# Patient Record
Sex: Male | Born: 1945 | Race: White | Hispanic: No | Marital: Married | Smoking: Former smoker
Health system: Southern US, Community
[De-identification: ages and names within clinical notes are randomized; demographics above are authoritative.]

## PROBLEM LIST (undated history)

## (undated) DIAGNOSIS — R110 Nausea: Secondary | ICD-10-CM

## (undated) DIAGNOSIS — J45909 Unspecified asthma, uncomplicated: Secondary | ICD-10-CM

## (undated) DIAGNOSIS — M25552 Pain in left hip: Secondary | ICD-10-CM

## (undated) DIAGNOSIS — E7800 Pure hypercholesterolemia, unspecified: Secondary | ICD-10-CM

## (undated) DIAGNOSIS — M25551 Pain in right hip: Secondary | ICD-10-CM

## (undated) DIAGNOSIS — I10 Essential (primary) hypertension: Secondary | ICD-10-CM

## (undated) DIAGNOSIS — K219 Gastro-esophageal reflux disease without esophagitis: Secondary | ICD-10-CM

## (undated) DIAGNOSIS — M1990 Unspecified osteoarthritis, unspecified site: Secondary | ICD-10-CM

## (undated) DIAGNOSIS — F419 Anxiety disorder, unspecified: Secondary | ICD-10-CM

## (undated) HISTORY — PX: COLONOSCOPY: SHX174

## (undated) HISTORY — PX: SEPTOPLASTY: SUR1290

## (undated) HISTORY — PX: HERNIA REPAIR: SHX51

## (undated) HISTORY — DX: Essential (primary) hypertension: I10

## (undated) HISTORY — DX: Gastro-esophageal reflux disease without esophagitis: K21.9

## (undated) HISTORY — DX: Nausea: R11.0

## (undated) HISTORY — PX: SHOULDER ARTHROSCOPY WITH LABRAL REPAIR: SHX5691

## (undated) HISTORY — DX: Pain in right hip: M25.551

## (undated) HISTORY — PX: UPPER GASTROINTESTINAL ENDOSCOPY: SHX188

## (undated) HISTORY — DX: Pain in left hip: M25.552

---

## 2015-07-06 ENCOUNTER — Encounter (INDEPENDENT_AMBULATORY_CARE_PROVIDER_SITE_OTHER): Payer: Self-pay | Admitting: *Deleted

## 2015-07-28 ENCOUNTER — Encounter (INDEPENDENT_AMBULATORY_CARE_PROVIDER_SITE_OTHER): Payer: Self-pay | Admitting: *Deleted

## 2015-07-28 ENCOUNTER — Encounter (INDEPENDENT_AMBULATORY_CARE_PROVIDER_SITE_OTHER): Payer: Self-pay

## 2015-10-18 ENCOUNTER — Ambulatory Visit (INDEPENDENT_AMBULATORY_CARE_PROVIDER_SITE_OTHER): Payer: Medicare Other | Admitting: Internal Medicine

## 2015-10-18 ENCOUNTER — Encounter (INDEPENDENT_AMBULATORY_CARE_PROVIDER_SITE_OTHER): Payer: Self-pay | Admitting: Internal Medicine

## 2015-10-18 VITALS — BP 150/90 | HR 70 | Temp 98.6°F | Resp 18 | Ht 73.0 in | Wt 204.0 lb

## 2015-10-18 DIAGNOSIS — I10 Essential (primary) hypertension: Secondary | ICD-10-CM | POA: Insufficient documentation

## 2015-10-18 DIAGNOSIS — R1310 Dysphagia, unspecified: Secondary | ICD-10-CM

## 2015-10-18 DIAGNOSIS — R1314 Dysphagia, pharyngoesophageal phase: Secondary | ICD-10-CM | POA: Diagnosis not present

## 2015-10-18 DIAGNOSIS — R1319 Other dysphagia: Secondary | ICD-10-CM

## 2015-10-18 DIAGNOSIS — K219 Gastro-esophageal reflux disease without esophagitis: Secondary | ICD-10-CM

## 2015-10-18 DIAGNOSIS — Z86010 Personal history of colonic polyps: Secondary | ICD-10-CM

## 2015-10-18 DIAGNOSIS — J45909 Unspecified asthma, uncomplicated: Secondary | ICD-10-CM | POA: Insufficient documentation

## 2015-10-18 NOTE — Patient Instructions (Signed)
Esophagogastroduodenoscopy with esophageal dilation and colonoscopy to be scheduled. High fiber diet.

## 2015-10-19 ENCOUNTER — Other Ambulatory Visit (INDEPENDENT_AMBULATORY_CARE_PROVIDER_SITE_OTHER): Payer: Self-pay | Admitting: Internal Medicine

## 2015-10-19 ENCOUNTER — Encounter (INDEPENDENT_AMBULATORY_CARE_PROVIDER_SITE_OTHER): Payer: Self-pay | Admitting: *Deleted

## 2015-10-19 ENCOUNTER — Other Ambulatory Visit (INDEPENDENT_AMBULATORY_CARE_PROVIDER_SITE_OTHER): Payer: Self-pay | Admitting: *Deleted

## 2015-10-19 DIAGNOSIS — Z86010 Personal history of colonic polyps: Secondary | ICD-10-CM

## 2015-10-19 DIAGNOSIS — R1310 Dysphagia, unspecified: Secondary | ICD-10-CM

## 2015-10-19 MED ORDER — PEG 3350-KCL-NA BICARB-NACL 420 G PO SOLR: 4000.0000 mL | Freq: Once | ORAL | Status: DC

## 2015-10-19 NOTE — Progress Notes (Signed)
Presenting complaint;  Solid food dysphagia and history of polyps.  History of present illness.:  Noah Rodgers is 70 year old Caucasian male who is referred through courtesy of this Shari Prows FNP for GI evaluation. Patient's GI physicians in [place] have retired. Patient states he has had symptoms of GERD for well over 10 years. He had EGD with esophageal dilation in December 2006. He was found to have hiatal hernia and Schatzki's ring. He underwent EGD again in 2009. He states esophageal dilation has provided relief only for a few months. He has difficulty swallowing solids. He has no difficulty with liquids. He states he needs to meet he has to cut into small cubes otherwise it does not go down. He points to suprasternal area site of bolus obstruction. He states heartburns well controlled with therapy. He denies hoarseness chronic cough or so throat. He watches his diet. He has history of colonic polyps. Last exam was 5 years ago and no polyps were found. He denies melena or rectal bleeding or diarrhea. At times he feels his stool is hard. He is trying to be fiber rich foods. He has good appetite and has not lost any weight.  Current Medications: Outpatient Encounter Prescriptions as of 10/18/2015  Medication Sig  . ALBUTEROL IN Inhale 2 puffs into the lungs every 6 (six) hours as needed.  Marland Kitchen amlodipine-olmesartan (AZOR) 10-20 MG tablet Take 1 tablet by mouth daily.  Marland Kitchen aspirin 81 MG tablet Take 81 mg by mouth daily.  . diazepam (VALIUM) 5 MG tablet Take 5 mg by mouth. Patient states that he takes 1/2 tablet as needed at night.  Marland Kitchen ibuprofen (ADVIL,MOTRIN) 800 MG tablet Take 800 mg by mouth as needed.  . lansoprazole (PREVACID) 30 MG capsule Take 30 mg by mouth daily at 12 noon.  . Misc Natural Products (PROSTATE SUPPORT PO) Take by mouth daily.  . montelukast (SINGULAIR) 10 MG tablet Take 10 mg by mouth daily.   No facility-administered encounter medications on file as of 10/18/2015.   Past  medical history: Hypertension of more than 10 years duration. Adult onset bronchial last month. He has not had a recent flareup. Chronic insomnia. Left shoulder pain secondary to rotator cuff tear. GERD of over 12 years duration. He had EGD with EGD in December 2006 and repeat procedure in May 2009. Benign prostatic hypertrophy. History of colonic polyps. He has had 3 colonoscopies. Polyps were found on the first exam but not on the subsequent exams. Last exam was in March 2011. History of diverticulitis. Chronic pain over right rib cage for which she has had steroid injections.   Allergies; No Known Allergies  Family history: Mother had her with arthritis and chronic kidney disease and died at age 73. Father lived to be 24. He had hypertension and multiple colonic polyps. He has one brother is 17 W. type 2 diabetes mellitus and one sister age 25 in good health.  Social history: He is married and has 2 grownup sons. He retired in 2015. He worked in Psychologist, forensic and also an Education officer, environmental for 35 years. He smokes cigarettes for about one year when he was in high school. He drinks alcohol no more than 10 drinks per week. He walks 1-2 miles daily.   Physical examination: Blood pressure 150/90, pulse 70, temperature 98.6 F (37 C), temperature source Oral, resp. rate 18, height 6\' 1"  (1.854 m), weight 204 lb (92.534 kg). Patient is alert and in no acute distress. Conjunctiva is pink. Sclera is nonicteric Oropharyngeal  mucosa is normal. No neck masses or thyromegaly noted. Cardiac exam with regular rhythm normal S1 and S2. No murmur or gallop noted. Lungs are clear to auscultation. Abdomen is symmetrical soft and nontender without organomegaly or masses. No LE edema or clubbing noted.    Assessment:  #1.  Esophageal dysphagia. He has history of Schatzki's ring which was last manipulated  2009. howeversymptom relief was tranient. Biopsy was negative for eosnophilic  esophagiis. Given his history eosinophilic esophagitis remainsin the differential. #2. Chronic GERD symptoms well controlled with therapy. #3.  History of colonic adenomas noted on first colonoscopy but not on the last 2 colonoscopies. He is due for surveillance colonoscopy.   Recommendations:  Esophagogastroduodenoscopy with esophageal dilation and surveillance colonoscopy in near future to be performed under monitored anesthesia care. Patient's questions have been answered and he is ready to proceed with endoscopic evaluation.

## 2015-10-19 NOTE — Telephone Encounter (Signed)
Patient needs trilyte 

## 2015-10-20 ENCOUNTER — Encounter (INDEPENDENT_AMBULATORY_CARE_PROVIDER_SITE_OTHER): Payer: Self-pay | Admitting: *Deleted

## 2015-11-22 ENCOUNTER — Other Ambulatory Visit (HOSPITAL_COMMUNITY): Payer: Medicare Other

## 2015-12-05 ENCOUNTER — Other Ambulatory Visit (HOSPITAL_COMMUNITY): Payer: Medicare Other

## 2015-12-09 ENCOUNTER — Ambulatory Visit (HOSPITAL_COMMUNITY): Admit: 2015-12-09 | Payer: Self-pay | Admitting: Internal Medicine

## 2015-12-09 ENCOUNTER — Encounter (HOSPITAL_COMMUNITY): Payer: Self-pay

## 2015-12-09 SURGERY — COLONOSCOPY WITH PROPOFOL
Anesthesia: Monitor Anesthesia Care

## 2016-05-16 ENCOUNTER — Encounter (INDEPENDENT_AMBULATORY_CARE_PROVIDER_SITE_OTHER): Payer: Self-pay | Admitting: *Deleted

## 2016-05-16 ENCOUNTER — Other Ambulatory Visit (INDEPENDENT_AMBULATORY_CARE_PROVIDER_SITE_OTHER): Payer: Self-pay | Admitting: *Deleted

## 2016-05-16 DIAGNOSIS — R1310 Dysphagia, unspecified: Secondary | ICD-10-CM

## 2016-05-16 DIAGNOSIS — Z86010 Personal history of colonic polyps: Secondary | ICD-10-CM

## 2016-05-18 ENCOUNTER — Telehealth (INDEPENDENT_AMBULATORY_CARE_PROVIDER_SITE_OTHER): Payer: Self-pay | Admitting: *Deleted

## 2016-05-18 NOTE — Telephone Encounter (Signed)
Referring MD/PCP: dianne elliott   Procedure: tcs/egd/ed  Reason/Indication:  Hx polyps, dysphagia  Has patient had this procedure before?  Yes--TCS (2011); EGD (2009)  If so, when, by whom and where?    Is there a family history of colon cancer?  no  Who?  What age when diagnosed?    Is patient diabetic?   no      Does patient have prosthetic heart valve or mechanical valve?  no  Do you have a pacemaker?  no  Has patient ever had endocarditis? no  Has patient had joint replacement within last 12 months?  no  Does patient tend to be constipated or take laxatives? no  Does patient have a history of alcohol/drug use?  no  Is patient on Coumadin, Plavix and/or Aspirin? yes  Medications: see epic  Allergies: nkda  Medication Adjustment: asa 2 days  Procedure date & time: 06/15/16 at 855 preop 11/28 @ 10

## 2016-05-21 NOTE — Telephone Encounter (Signed)
agree

## 2016-06-06 NOTE — Patient Instructions (Signed)
Noah Rodgers  06/06/2016     @PREFPERIOPPHARMACY @   Your procedure is scheduled on 06/15/2016.  Report to [facility] at 7:25 A.M.  Call this number if you have problems the morning of surgery:  [phone]   Remember:  Do not eat food or drink liquids after midnight.  Take these medicines the morning of surgery with A SIP OF WATER Albuterol inhaler and bring with you to the hospital, Valium, Hydrocodone, Prevacid, Singulair   Do not wear jewelry, make-up or nail polish.  Do not wear lotions, powders, or perfumes, or deoderant.  Do not shave 48 hours prior to surgery.  Men may shave face and neck.  Do not bring valuables to the hospital.  [facility] is not responsible for any belongings or valuables.  Contacts, dentures or bridgework may not be worn into surgery.  Leave your suitcase in the car.  After surgery it may be brought to your room.  For patients admitted to the hospital, discharge time will be determined by your treatment team.  Patients discharged the day of surgery will not be allowed to drive home.    Please read over the following fact sheets that you were given. Anesthesia Post-op Instructions     PATIENT INSTRUCTIONS POST-ANESTHESIA  IMMEDIATELY FOLLOWING SURGERY:  Do not drive or operate machinery for the first twenty four hours after surgery.  Do not make any important decisions for twenty four hours after surgery or while taking narcotic pain medications or sedatives.  If you develop intractable nausea and vomiting or a severe headache please notify your doctor immediately.  FOLLOW-UP:  Please make an appointment with your surgeon as instructed. You do not need to follow up with anesthesia unless specifically instructed to do so.  WOUND CARE INSTRUCTIONS (if applicable):  Keep a dry clean dressing on the anesthesia/puncture wound site if there is drainage.  Once the wound has quit draining you may leave it open to air.  Generally you should leave the  bandage intact for twenty four hours unless there is drainage.  If the epidural site drains for more than 36-48 hours please call the anesthesia department.  QUESTIONS?:  Please feel free to call your physician or the hospital operator if you have any questions, and they will be happy to assist you.      Esophagogastroduodenoscopy Introduction Esophagogastroduodenoscopy (EGD) is a procedure to examine the lining of the esophagus, stomach, and first part of the small intestine (duodenum). This procedure is done to check for problems such as inflammation, bleeding, ulcers, or growths. During this procedure, a long, flexible, lighted tube with a camera attached (endoscope) is inserted down the throat. Tell a health care provider about:  Any allergies you have.  All medicines you are taking, including vitamins, herbs, eye drops, creams, and over-the-counter medicines.  Any problems you or family members have had with anesthetic medicines.  Any blood disorders you have.  Any surgeries you have had.  Any medical conditions you have.  Whether you are pregnant or may be pregnant. What are the risks? Generally, this is a safe procedure. However, problems may occur, including:  Infection.  Bleeding.  A tear (perforation) in the esophagus, stomach, or duodenum.  Trouble breathing.  Excessive sweating.  Spasms of the larynx.  A slowed heartbeat.  Low blood pressure. What happens before the procedure?  Follow instructions from your health care provider about eating or drinking restrictions.  Ask your health care provider about:  Changing or stopping  your regular medicines. This is especially important if you are taking diabetes medicines or blood thinners.  Taking medicines such as aspirin and ibuprofen. These medicines can thin your blood. Do not take these medicines before your procedure if your health care provider instructs you not to.  Plan to have someone take you home  after the procedure.  If you wear dentures, be ready to remove them before the procedure. What happens during the procedure?  To reduce your risk of infection, your health care team will wash or sanitize their hands.  An IV tube will be put in a vein in your hand or arm. You will get medicines and fluids through this tube.  You will be given one or more of the following:  A medicine to help you relax (sedative).  A medicine to numb the area (local anesthetic). This medicine may be sprayed into your throat. It will make you feel more comfortable and keep you from gagging or coughing during the procedure.  A medicine for pain.  A mouth guard may be placed in your mouth to protect your teeth and to keep you from biting on the endoscope.  You will be asked to lie on your left side.  The endoscope will be lowered down your throat into your esophagus, stomach, and duodenum.  Air will be put into the endoscope. This will help your health care provider see better.  The lining of your esophagus, stomach, and duodenum will be examined.  Your health care provider may:  Take a tissue sample so it can be looked at in a lab (biopsy).  Remove growths.  Remove objects (foreign bodies) that are stuck.  Treat any bleeding with medicines or other devices that stop tissue from bleeding.  Widen (dilate) or stretch narrowed areas of your esophagus and stomach.  The endoscope will be taken out. The procedure may vary among health care providers and hospitals. What happens after the procedure?  Your blood pressure, heart rate, breathing rate, and blood oxygen level will be monitored often until the medicines you were given have worn off.  Do not eat or drink anything until the numbing medicine has worn off and your gag reflex has returned. This information is not intended to replace advice given to you by your health care provider. Make sure you discuss any questions you have with your health  care provider. Document Released: 11/02/2004 Document Revised: 12/08/2015 Document Reviewed: 05/26/2015  2017 Elsevier Esophageal Dilatation Esophageal dilatation is a procedure to open a blocked or narrowed part of the esophagus. The esophagus is the long tube in your throat that carries food and liquid from your mouth to your stomach. The procedure is also called esophageal dilation. You may need this procedure if you have a buildup of scar tissue in your esophagus that makes it difficult, painful, or even impossible to swallow. This can be caused by gastroesophageal reflux disease (GERD). In rare cases, people need this procedure because they have cancer of the esophagus or a problem with the way food moves through the esophagus. Sometimes you may need to have another dilatation to enlarge the opening of the esophagus gradually. Tell a health care provider about:  Any allergies you have.  All medicines you are taking, including vitamins, herbs, eye drops, creams, and over-the-counter medicines.  Any problems you or family members have had with anesthetic medicines.  Any blood disorders you have.  Any surgeries you have had.  Any medical conditions you have.  Any antibiotic  medicines you are required to take before dental procedures. What are the risks? Generally, this is a safe procedure. However, problems can occur and include:  Bleeding from a tear in the lining of the esophagus.  A hole (perforation) in the esophagus. What happens before the procedure?  Do not eat or drink anything after midnight on the night before the procedure or as directed by your health care provider.  Ask your health care provider about changing or stopping your regular medicines. This is especially important if you are taking diabetes medicines or blood thinners.  Plan to have someone take you home after the procedure. What happens during the procedure?  You will be given a medicine that makes you  relaxed and sleepy (sedative).  A medicine may be sprayed or gargled to numb the back of the throat.  Your health care provider can use various instruments to do an esophageal dilatation. During the procedure, the instrument used will be placed in your mouth and passed down into your esophagus. Options include:  Simple dilators. This instrument is carefully placed in the esophagus to stretch it.  Guided wire bougies. In this method, a flexible tube (endoscope) is used to insert a wire into the esophagus. The dilator is passed over this wire to enlarge the esophagus. Then the wire is removed.  Balloon dilators. An endoscope with a small balloon at the end is passed down into the esophagus. Inflating the balloon gently stretches the esophagus and opens it up. What happens after the procedure?  Your blood pressure, heart rate, breathing rate, and blood oxygen level will be monitored often until the medicines you were given have worn off.  Your throat may feel slightly sore and will probably still feel numb. This will improve slowly over time.  You will not be allowed to eat or drink until the throat numbness has resolved.  If this is a same-day procedure, you may be allowed to go home once you have been able to drink, urinate, and sit on the edge of the bed without nausea or dizziness.  If this is a same-day procedure, you should have a friend or family member with you for the next 24 hours after the procedure. This information is not intended to replace advice given to you by your health care provider. Make sure you discuss any questions you have with your health care provider. Document Released: 08/23/2005 Document Revised: 12/08/2015 Document Reviewed: 11/11/2013 Elsevier Interactive Patient Education  2017 [place]. Colonoscopy, Adult A colonoscopy is an exam to look at the entire large intestine. During the exam, a lubricated, bendable tube is inserted into the anus and then passed  into the rectum, colon, and other parts of the large intestine. A colonoscopy is often done as a part of normal colorectal screening or in response to certain symptoms, such as anemia, persistent diarrhea, abdominal pain, and blood in the stool. The exam can help screen for and diagnose medical problems, including:  Tumors.  Polyps.  Inflammation.  Areas of bleeding. Tell a health care provider about:  Any allergies you have.  All medicines you are taking, including vitamins, herbs, eye drops, creams, and over-the-counter medicines.  Any problems you or family members have had with anesthetic medicines.  Any blood disorders you have.  Any surgeries you have had.  Any medical conditions you have.  Any problems you have had passing stool. What are the risks? Generally, this is a safe procedure. However, problems may occur, including:  Bleeding.  A tear in the intestine.  A reaction to medicines given during the exam.  Infection (rare). What happens before the procedure? Eating and drinking restrictions  Follow instructions from your health care provider about eating and drinking, which may include:  A few days before the procedure - follow a low-fiber diet. Avoid nuts, seeds, dried fruit, raw fruits, and vegetables.  1-3 days before the procedure - follow a clear liquid diet. Drink only clear liquids, such as clear broth or bouillon, black coffee or tea, clear juice, clear soft drinks or sports drinks, gelatin desert, and popsicles. Avoid any liquids that contain red or purple dye.  On the day of the procedure - do not eat or drink anything during the 2 hours before the procedure, or within the time period that your health care provider recommends. Bowel prep  If you were prescribed an oral bowel prep to clean out your colon:  Take it as told by your health care provider. Starting the day before your procedure, you will need to drink a large amount of medicated liquid.  The liquid will cause you to have multiple loose stools until your stool is almost clear or light green.  If your skin or anus gets irritated from diarrhea, you may use these to relieve the irritation:  Medicated wipes, such as adult wet wipes with aloe and vitamin E.  A skin soothing-product like petroleum jelly.  If you vomit while drinking the bowel prep, take a break for up to 60 minutes and then begin the bowel prep again. If vomiting continues and you cannot take the bowel prep without vomiting, call your health care provider. General instructions  Ask your health care provider about changing or stopping your regular medicines. This is especially important if you are taking diabetes medicines or blood thinners.  Plan to have someone take you home from the hospital or clinic. What happens during the procedure?  An IV tube may be inserted into one of your veins.  You will be given medicine to help you relax (sedative).  To reduce your risk of infection:  Your health care team will wash or sanitize their hands.  Your anal area will be washed with soap.  You will be asked to lie on your side with your knees bent.  Your health care provider will lubricate a long, thin, flexible tube. The tube will have a camera and a light on the end.  The tube will be inserted into your anus.  The tube will be gently eased through your rectum and colon.  Air will be delivered into your colon to keep it open. You may feel some pressure or cramping.  The camera will be used to take images during the procedure.  A small tissue sample may be removed from your body to be examined under a microscope (biopsy). If any potential problems are found, the tissue will be sent to a lab for testing.  If small polyps are found, your health care provider may remove them and have them checked for cancer cells.  The tube that was inserted into your anus will be slowly removed. The procedure may vary among  health care providers and hospitals. What happens after the procedure?  Your blood pressure, heart rate, breathing rate, and blood oxygen level will be monitored until the medicines you were given have worn off.  Do not drive for 24 hours after the exam.  You may have a small amount of blood in your stool.  You  may pass gas and have mild abdominal cramping or bloating due to the air that was used to inflate your colon during the exam.  It is up to you to get the results of your procedure. Ask your health care provider, or the department performing the procedure, when your results will be ready. This information is not intended to replace advice given to you by your health care provider. Make sure you discuss any questions you have with your health care provider. Document Released: 06/29/2000 Document Revised: 01/20/2016 Document Reviewed: 09/13/2015 Elsevier Interactive Patient Education  2017 [employer].

## 2016-06-12 ENCOUNTER — Encounter (HOSPITAL_COMMUNITY): Payer: Self-pay

## 2016-06-12 ENCOUNTER — Encounter (HOSPITAL_COMMUNITY)
Admission: RE | Admit: 2016-06-12 | Discharge: 2016-06-12 | Disposition: A | Discharge status: Home / Self Care | Payer: Medicare Other | Source: Ambulatory Visit | Attending: Internal Medicine | Admitting: Internal Medicine

## 2016-06-12 DIAGNOSIS — M1990 Unspecified osteoarthritis, unspecified site: Secondary | ICD-10-CM | POA: Diagnosis not present

## 2016-06-12 DIAGNOSIS — Z87891 Personal history of nicotine dependence: Secondary | ICD-10-CM | POA: Diagnosis not present

## 2016-06-12 DIAGNOSIS — E7800 Pure hypercholesterolemia, unspecified: Secondary | ICD-10-CM | POA: Diagnosis not present

## 2016-06-12 DIAGNOSIS — F419 Anxiety disorder, unspecified: Secondary | ICD-10-CM | POA: Diagnosis not present

## 2016-06-12 DIAGNOSIS — R1310 Dysphagia, unspecified: Secondary | ICD-10-CM | POA: Insufficient documentation

## 2016-06-12 DIAGNOSIS — R1314 Dysphagia, pharyngoesophageal phase: Secondary | ICD-10-CM | POA: Diagnosis not present

## 2016-06-12 DIAGNOSIS — Z01818 Encounter for other preprocedural examination: Secondary | ICD-10-CM

## 2016-06-12 DIAGNOSIS — K644 Residual hemorrhoidal skin tags: Secondary | ICD-10-CM | POA: Diagnosis not present

## 2016-06-12 DIAGNOSIS — D125 Benign neoplasm of sigmoid colon: Secondary | ICD-10-CM | POA: Diagnosis not present

## 2016-06-12 DIAGNOSIS — Z1211 Encounter for screening for malignant neoplasm of colon: Secondary | ICD-10-CM | POA: Diagnosis present

## 2016-06-12 DIAGNOSIS — Z7982 Long term (current) use of aspirin: Secondary | ICD-10-CM | POA: Diagnosis not present

## 2016-06-12 DIAGNOSIS — Z86010 Personal history of colonic polyps: Secondary | ICD-10-CM | POA: Insufficient documentation

## 2016-06-12 DIAGNOSIS — I10 Essential (primary) hypertension: Secondary | ICD-10-CM | POA: Diagnosis not present

## 2016-06-12 DIAGNOSIS — K210 Gastro-esophageal reflux disease with esophagitis: Secondary | ICD-10-CM | POA: Diagnosis not present

## 2016-06-12 DIAGNOSIS — J45909 Unspecified asthma, uncomplicated: Secondary | ICD-10-CM | POA: Diagnosis not present

## 2016-06-12 DIAGNOSIS — D123 Benign neoplasm of transverse colon: Secondary | ICD-10-CM | POA: Diagnosis not present

## 2016-06-12 HISTORY — DX: Pure hypercholesterolemia, unspecified: E78.00

## 2016-06-12 HISTORY — DX: Unspecified asthma, uncomplicated: J45.909

## 2016-06-12 HISTORY — DX: Anxiety disorder, unspecified: F41.9

## 2016-06-12 HISTORY — DX: Unspecified osteoarthritis, unspecified site: M19.90

## 2016-06-12 LAB — CBC
HCT: 44.3 % (ref 39.0–52.0)
HEMOGLOBIN: 14.8 g/dL (ref 13.0–17.0)
MCH: 30.8 pg (ref 26.0–34.0)
MCHC: 33.4 g/dL (ref 30.0–36.0)
MCV: 92.1 fL (ref 78.0–100.0)
PLATELETS: 207 10*3/uL (ref 150–400)
RBC: 4.81 MIL/uL (ref 4.22–5.81)
RDW: 13.8 % (ref 11.5–15.5)
WBC: 8.3 10*3/uL (ref 4.0–10.5)

## 2016-06-12 LAB — BASIC METABOLIC PANEL
Anion gap: 7 (ref 5–15)
BUN: 16 mg/dL (ref 6–20)
CHLORIDE: 104 mmol/L (ref 101–111)
CO2: 28 mmol/L (ref 22–32)
CREATININE: 0.76 mg/dL (ref 0.61–1.24)
Calcium: 9.5 mg/dL (ref 8.9–10.3)
Glucose, Bld: 92 mg/dL (ref 65–99)
Potassium: 3.4 mmol/L — ABNORMAL LOW (ref 3.5–5.1)
SODIUM: 139 mmol/L (ref 135–145)

## 2016-06-15 ENCOUNTER — Ambulatory Visit (HOSPITAL_COMMUNITY): Payer: Medicare Other | Admitting: Anesthesiology

## 2016-06-15 ENCOUNTER — Ambulatory Visit (HOSPITAL_COMMUNITY)
Admission: RE | Admit: 2016-06-15 | Discharge: 2016-06-15 | Disposition: A | Discharge status: Home / Self Care | Payer: Medicare Other | Source: Ambulatory Visit | Attending: Internal Medicine | Admitting: Internal Medicine

## 2016-06-15 ENCOUNTER — Encounter (HOSPITAL_COMMUNITY)
Admission: RE | Disposition: A | Discharge status: Home / Self Care | Payer: Self-pay | Source: Ambulatory Visit | Attending: Internal Medicine

## 2016-06-15 ENCOUNTER — Encounter (HOSPITAL_COMMUNITY): Payer: Self-pay

## 2016-06-15 DIAGNOSIS — D125 Benign neoplasm of sigmoid colon: Secondary | ICD-10-CM | POA: Diagnosis not present

## 2016-06-15 DIAGNOSIS — D123 Benign neoplasm of transverse colon: Secondary | ICD-10-CM | POA: Diagnosis not present

## 2016-06-15 DIAGNOSIS — K644 Residual hemorrhoidal skin tags: Secondary | ICD-10-CM | POA: Diagnosis not present

## 2016-06-15 DIAGNOSIS — M1990 Unspecified osteoarthritis, unspecified site: Secondary | ICD-10-CM | POA: Insufficient documentation

## 2016-06-15 DIAGNOSIS — Z1211 Encounter for screening for malignant neoplasm of colon: Secondary | ICD-10-CM | POA: Diagnosis not present

## 2016-06-15 DIAGNOSIS — Z7982 Long term (current) use of aspirin: Secondary | ICD-10-CM | POA: Insufficient documentation

## 2016-06-15 DIAGNOSIS — Z09 Encounter for follow-up examination after completed treatment for conditions other than malignant neoplasm: Secondary | ICD-10-CM | POA: Diagnosis not present

## 2016-06-15 DIAGNOSIS — R1310 Dysphagia, unspecified: Secondary | ICD-10-CM | POA: Insufficient documentation

## 2016-06-15 DIAGNOSIS — Z87891 Personal history of nicotine dependence: Secondary | ICD-10-CM | POA: Insufficient documentation

## 2016-06-15 DIAGNOSIS — J45909 Unspecified asthma, uncomplicated: Secondary | ICD-10-CM | POA: Insufficient documentation

## 2016-06-15 DIAGNOSIS — K210 Gastro-esophageal reflux disease with esophagitis: Secondary | ICD-10-CM | POA: Insufficient documentation

## 2016-06-15 DIAGNOSIS — Z86010 Personal history of colonic polyps: Secondary | ICD-10-CM | POA: Insufficient documentation

## 2016-06-15 DIAGNOSIS — F419 Anxiety disorder, unspecified: Secondary | ICD-10-CM | POA: Insufficient documentation

## 2016-06-15 DIAGNOSIS — E7800 Pure hypercholesterolemia, unspecified: Secondary | ICD-10-CM | POA: Insufficient documentation

## 2016-06-15 DIAGNOSIS — R1314 Dysphagia, pharyngoesophageal phase: Secondary | ICD-10-CM | POA: Insufficient documentation

## 2016-06-15 DIAGNOSIS — K219 Gastro-esophageal reflux disease without esophagitis: Secondary | ICD-10-CM

## 2016-06-15 DIAGNOSIS — I10 Essential (primary) hypertension: Secondary | ICD-10-CM | POA: Insufficient documentation

## 2016-06-15 HISTORY — PX: BIOPSY: SHX5522

## 2016-06-15 HISTORY — PX: ESOPHAGEAL DILATION: SHX303

## 2016-06-15 HISTORY — PX: POLYPECTOMY: SHX5525

## 2016-06-15 HISTORY — PX: COLONOSCOPY WITH PROPOFOL: SHX5780

## 2016-06-15 HISTORY — PX: ESOPHAGOGASTRODUODENOSCOPY (EGD) WITH PROPOFOL: SHX5813

## 2016-06-15 SURGERY — COLONOSCOPY WITH PROPOFOL
Anesthesia: Monitor Anesthesia Care

## 2016-06-15 MED ORDER — BUTAMBEN-TETRACAINE-BENZOCAINE 2-2-14 % EX AERO
INHALATION_SPRAY | CUTANEOUS | Status: AC
  Filled 2016-06-15: qty 20

## 2016-06-15 MED ORDER — PROPOFOL 500 MG/50ML IV EMUL
INTRAVENOUS | Status: DC | PRN
  Administered 2016-06-15 (×2): via INTRAVENOUS
  Administered 2016-06-15: 125 ug/kg/min via INTRAVENOUS

## 2016-06-15 MED ORDER — LIDOCAINE HCL (PF) 1 % IJ SOLN
INTRAMUSCULAR | Status: AC
  Filled 2016-06-15: qty 5

## 2016-06-15 MED ORDER — LACTATED RINGERS IV SOLN
INTRAVENOUS | Status: DC
  Administered 2016-06-15: 08:00:00 via INTRAVENOUS

## 2016-06-15 MED ORDER — MIDAZOLAM HCL 2 MG/2ML IJ SOLN
1.0000 mg | INTRAMUSCULAR | Status: DC | PRN
  Administered 2016-06-15: 2 mg via INTRAVENOUS

## 2016-06-15 MED ORDER — MIDAZOLAM HCL 2 MG/2ML IJ SOLN
INTRAMUSCULAR | Status: AC
  Filled 2016-06-15: qty 2

## 2016-06-15 MED ORDER — PROPOFOL 10 MG/ML IV BOLUS
INTRAVENOUS | Status: AC
  Filled 2016-06-15: qty 40

## 2016-06-15 MED ORDER — FENTANYL CITRATE (PF) 100 MCG/2ML IJ SOLN
25.0000 ug | INTRAMUSCULAR | Status: AC | PRN
  Administered 2016-06-15 (×2): 25 ug via INTRAVENOUS

## 2016-06-15 MED ORDER — FENTANYL CITRATE (PF) 100 MCG/2ML IJ SOLN
INTRAMUSCULAR | Status: AC
  Filled 2016-06-15: qty 2

## 2016-06-15 MED ORDER — STERILE WATER FOR IRRIGATION IR SOLN
Status: DC | PRN
  Administered 2016-06-15: 100 mL

## 2016-06-15 MED ORDER — PROPOFOL 10 MG/ML IV BOLUS
INTRAVENOUS | Status: DC | PRN
  Administered 2016-06-15: 10 mg via INTRAVENOUS
  Administered 2016-06-15: 30 mg via INTRAVENOUS

## 2016-06-15 NOTE — Anesthesia Postprocedure Evaluation (Signed)
Anesthesia Post Note  Patient: Cathey Endow Alessandrini  Procedure(s) Performed: Procedure(s) (LRB): COLONOSCOPY WITH PROPOFOL (N/A) ESOPHAGOGASTRODUODENOSCOPY (EGD) WITH PROPOFOL (N/A) ESOPHAGEAL DILATION (N/A) BIOPSY POLYPECTOMY  Patient location during evaluation: PACU Anesthesia Type: MAC Level of consciousness: awake and alert Pain management: pain level controlled Vital Signs Assessment: post-procedure vital signs reviewed and stable Respiratory status: spontaneous breathing Cardiovascular status: blood pressure returned to baseline Postop Assessment: no signs of nausea or vomiting Anesthetic complications: no    Last Vitals:  Vitals:   06/15/16 0755 06/15/16 0800  BP: 134/89 134/82  Pulse:    Resp: 20 20  Temp:      Last Pain: There were no vitals filed for this visit.               Adaya Garmany

## 2016-06-15 NOTE — Anesthesia Preprocedure Evaluation (Signed)
Anesthesia Evaluation  Patient identified by MRN, date of birth, ID band Patient awake    Reviewed: Allergy & Precautions, NPO status , Patient's Chart, lab work & pertinent test results  Airway Mallampati: I  TM Distance: >3 FB     Dental  (+) Teeth Intact   Pulmonary asthma , former smoker,    breath sounds clear to auscultation       Cardiovascular hypertension, Pt. on medications  Rhythm:Regular Rate:Normal     Neuro/Psych PSYCHIATRIC DISORDERS Anxiety    GI/Hepatic GERD  ,  Endo/Other    Renal/GU      Musculoskeletal   Abdominal   Peds  Hematology   Anesthesia Other Findings   Reproductive/Obstetrics                             Anesthesia Physical Anesthesia Plan  ASA: II  Anesthesia Plan: MAC   Post-op Pain Management:    Induction: Intravenous  Airway Management Planned: Simple Face Mask  Additional Equipment:   Intra-op Plan:   Post-operative Plan:   Informed Consent: I have reviewed the patients History and Physical, chart, labs and discussed the procedure including the risks, benefits and alternatives for the proposed anesthesia with the patient or authorized representative who has indicated his/her understanding and acceptance.     Plan Discussed with:   Anesthesia Plan Comments:         Anesthesia Quick Evaluation

## 2016-06-15 NOTE — H&P (Signed)
Noah Rodgers is an 70 y.o. male.   Chief Complaint: Patient is here for EGD, ED and colonoscopy. HPI: Patient is 70 year old Caucasian male was chronic GERD and presents with several month history of dysphagia to solids. He points to suprasternal area/upper sternal areas site of bolus obstruction. He says heartburns well controlled with therapy. He has undergone esophageal dilation the past but it provided transient relief. He has history of colonic polyps. Last exam was 5 years ago. He denies change in bowel habits or rectal bleeding. He complains of pain in right low rib cage/flank area. He has had this pain chronically and has been extensively evaluated.  Past Medical History:  Diagnosis Date  . Anxiety   . Arthritis   . Asthma   . GERD (gastroesophageal reflux disease)   . Hypercholesteremia   . Hypertension     Past Surgical History:  Procedure Laterality Date  . COLONOSCOPY     6 years ago in [place]  . HERNIA REPAIR Right    Inguinal  . SEPTOPLASTY    . SHOULDER ARTHROSCOPY WITH LABRAL REPAIR Left   . UPPER GASTROINTESTINAL ENDOSCOPY      Family History  Problem Relation Age of Onset  . Arthritis Mother   . Kidney disease Mother   . Hypertension Father   . Healthy Sister   . Diabetes Brother   . Colitis Son   . Healthy Paternal Grandfather    Social History:  reports that he quit smoking about 47 years ago. His smoking use included Cigarettes. He has a 1.00 pack-year smoking history. He has never used smokeless tobacco. He reports that he drinks alcohol. He reports that he does not use drugs.  Allergies: No Known Allergies  Medications Prior to Admission  Medication Sig Dispense Refill  . albuterol (PROVENTIL HFA;VENTOLIN HFA) 108 (90 Base) MCG/ACT inhaler Inhale 2 puffs into the lungs every 6 (six) hours as needed for wheezing or shortness of breath.    Marland Kitchen amlodipine-olmesartan (AZOR) 10-20 MG tablet Take 1 tablet by mouth daily.    Marland Kitchen aspirin 81 MG tablet  Take 81 mg by mouth daily.    . Cholecalciferol (VITAMIN D PO) Take 1 tablet by mouth daily.    . diazepam (VALIUM) 5 MG tablet Take 2.5 mg by mouth at bedtime as needed for anxiety or muscle spasms.     Marland Kitchen HYDROcodone-acetaminophen (NORCO/VICODIN) 5-325 MG tablet Take 1 tablet by mouth daily as needed for moderate pain.    Marland Kitchen lansoprazole (PREVACID) 30 MG capsule Take 30 mg by mouth daily.     . Misc Natural Products (PROSTATE SUPPORT PO) Take 1 tablet by mouth daily.     . montelukast (SINGULAIR) 10 MG tablet Take 10 mg by mouth at bedtime as needed (breathing problems).     . pravastatin (PRAVACHOL) 10 MG tablet Take 10 mg by mouth every evening.    . predniSONE (DELTASONE) 5 MG tablet Take 15 mg by mouth daily with breakfast. Take 15mg  till 06/15/16    . polyethylene glycol-electrolytes (NULYTELY/GOLYTELY) 420 g solution Take 4,000 mLs by mouth once. (Patient not taking: Reported on 06/04/2016) 4000 mL 0    No results found for this or any previous visit (from the past 48 hour(s)). No results found.  ROS  Blood pressure 134/82, pulse 68, temperature 97.5 F (36.4 C), resp. rate 20, height 6\' 1"  (1.854 m), weight 202 lb (91.6 kg), SpO2 98 %. Physical Exam  Constitutional: He appears well-developed and well-nourished.  HENT:  Mouth/Throat: Oropharynx is clear and moist.  Eyes: Conjunctivae are normal. No scleral icterus.  Neck: No thyromegaly present.  Cardiovascular:  Regular rhythm normal S1 with split S2 and faint systolic ejection murmur best heard at LLSB.  Respiratory: Effort normal and breath sounds normal.  GI:  Abdomen is soft and nontender without organomegaly or masses.  Musculoskeletal: He exhibits no edema.  Lymphadenopathy:    He has no cervical adenopathy.  Neurological: He is alert.  Skin: Skin is warm and dry.     Assessment/Plan Solid food dysphagia in a patient with chronic GERD. History of colonic adenomas. EGD with ED and colonoscopy under monitored  anesthesia care.  Hildred Laser, MD 06/15/2016, 8:26 AM

## 2016-06-15 NOTE — Op Note (Signed)
[facility] Patient Name: Noah Rodgers Procedure Date: 06/15/2016 7:48 AM MRN: [id number] Date of Birth: [birth year] Attending MD: Hildred Laser , MD CSN: [id number] Age: 70 Admit Type: Outpatient Procedure:                Upper GI endoscopy Indications:              Esophageal dysphagia Providers:                Hildred Laser, MD, Rosina Lowenstein, RN, Isabella Stalling, Technician Referring MD:             Laray Anger NP, NP Medicines:                Cetacaine spray, Propofol per Anesthesia Complications:            No immediate complications. Estimated Blood Loss:     Estimated blood loss was minimal. Procedure:                Pre-Anesthesia Assessment:                           - Prior to the procedure, a History and Physical                            was performed, and patient medications and                            allergies were reviewed. The patient's tolerance of                            previous anesthesia was also reviewed. The risks                            and benefits of the procedure and the sedation                            options and risks were discussed with the patient.                            All questions were answered, and informed consent                            was obtained. Prior Anticoagulants: The patient                            last took aspirin 7 days prior to the procedure.                            ASA Grade Assessment: II - A patient with mild                            systemic disease. After reviewing the risks and  benefits, the patient was deemed in satisfactory                            condition to undergo the procedure.                           After obtaining informed consent, the endoscope was                            passed under direct vision. Throughout the                            procedure, the patient's blood pressure, pulse, and     oxygen saturations were monitored continuously. The                            [id number]) scope was introduced through the                            and advanced to the second part of duodenum. The                            upper GI endoscopy was accomplished without                            difficulty. The patient tolerated the procedure                            well. Scope In: 8:38:44 AM Scope Out: 8:55:26 AM Total Procedure Duration: 0 hours 16 minutes 42 seconds  Findings:      The examined esophagus was normal.      The Z-line was irregular and was found 43 cm from the incisors.      No endoscopic abnormality was evident in the esophagus to explain the       patient's complaint of dysphagia. It was decided, however, to proceed       with dilation of the entire esophagus. The scope was withdrawn. Dilation       was performed with 4 and 56 Fr Maloney dilators. The dilation site was       examined following endoscope reinsertion and showed {skip}linear mucosa       indicated of a web. Biopsies were taken with a cold forceps for       histology. The pathology specimen was placed into Bottle Number 1.      The entire examined stomach was normal.      The duodenal bulb and second portion of the duodenum were normal. Impression:               - Normal esophagus.                           - Z-line irregular, 43 cm from the incisors.                           - No endoscopic esophageal abnormality to explain  patient's dysphagia. Esophagus dilated. Dilated.                            Biopsied.                           - Normal stomach.                           - Normal duodenal bulb and second portion of the                            duodenum. Moderate Sedation:      Per Anesthesia Care Recommendation:           - Patient has a contact number available for                            emergencies. The signs and symptoms of potential                             delayed complications were discussed with the                            patient. Return to normal activities tomorrow.                            Written discharge instructions were provided to the                            patient.                           - Resume previous diet today.                           - Continue present medications.                           - Await pathology results.                           - See the other procedure note for documentation of                            additional recommendations. Procedure Code(s):        --- Professional ---                           [phone], Esophagogastroduodenoscopy, flexible,                            transoral; with biopsy, single or multiple                           43450, Dilation of esophagus, by unguided sound or  bougie, single or multiple passes Diagnosis Code(s):        --- Professional ---                           K22.8, Other specified diseases of esophagus                           R13.14, Dysphagia, pharyngoesophageal phase CPT copyright 2016 American Medical Association. All rights reserved. The codes documented in this report are preliminary and upon coder review may  be revised to meet current compliance requirements. Hildred Laser, MD Hildred Laser, MD 06/15/2016 9:31:57 AM This report has been signed electronically. Number of Addenda: 0

## 2016-06-15 NOTE — Discharge Instructions (Signed)
No Aspirin or NSAIDs for 1 week. Resume other medications and diet as before. No driving for 24 hours. Physician will call with biopsy results.  Esophagogastroduodenoscopy, Care After Introduction Refer to this sheet in the next few weeks. These instructions provide you with information about caring for yourself after your procedure. Your health care provider may also give you more specific instructions. Your treatment has been planned according to current medical practices, but problems sometimes occur. Call your health care provider if you have any problems or questions after your procedure. What can I expect after the procedure? After the procedure, it is common to have:  A sore throat.  Nausea.  Bloating.  Dizziness.  Fatigue. Follow these instructions at home:  Do not eat or drink anything until the numbing medicine (local anesthetic) has worn off and your gag reflex has returned. You will know that the local anesthetic has worn off when you can swallow comfortably.  Do not drive for 24 hours if you received a medicine to help you relax (sedative).  If your health care provider took a tissue sample for testing during the procedure, make sure to get your test results. This is your responsibility. Ask your health care provider or the department performing the test when your results will be ready.  Keep all follow-up visits as told by your health care provider. This is important. Contact a health care provider if:  You cannot stop coughing.  You are not urinating.  You are urinating less than usual. Get help right away if:  You have trouble swallowing.  You cannot eat or drink.  You have throat or chest pain that gets worse.  You are dizzy or light-headed.  You faint.  You have nausea or vomiting.  You have chills.  You have a fever.  You have severe abdominal pain.  You have black, tarry, or bloody stools. This information is not intended to replace advice  given to you by your health care provider. Make sure you discuss any questions you have with your health care provider. Document Released: 06/18/2012 Document Revised: 12/08/2015 Document Reviewed: 05/26/2015  2017 Elsevier   Colonoscopy, Adult, Care After This sheet gives you information about how to care for yourself after your procedure. Your health care provider may also give you more specific instructions. If you have problems or questions, contact your health care provider. What can I expect after the procedure? After the procedure, it is common to have:  A small amount of blood in your stool for 24 hours after the procedure.  Some gas.  Mild abdominal cramping or bloating. Follow these instructions at home: General instructions  For the first 24 hours after the procedure:  Do not drive or use machinery.  Do not sign important documents.  Do not drink alcohol.  Do your regular daily activities at a slower pace than normal.  Eat soft, easy-to-digest foods.  Rest often.  Take over-the-counter or prescription medicines only as told by your health care provider.  It is up to you to get the results of your procedure. Ask your health care provider, or the department performing the procedure, when your results will be ready. Relieving cramping and bloating  Try walking around when you have cramps or feel bloated.  Apply heat to your abdomen as told by your health care provider. Use a heat source that your health care provider recommends, such as a moist heat pack or a heating pad.  Place a towel between your skin and  the heat source.  Leave the heat on for 20-30 minutes.  Remove the heat if your skin turns bright red. This is especially important if you are unable to feel pain, heat, or cold. You may have a greater risk of getting burned. Eating and drinking  Drink enough fluid to keep your urine clear or pale yellow.  Resume your normal diet as instructed by your  health care provider. Avoid heavy or fried foods that are hard to digest.  Avoid drinking alcohol for as long as instructed by your health care provider. Contact a health care provider if:  You have blood in your stool 2-3 days after the procedure. Get help right away if:  You have more than a small spotting of blood in your stool.  You pass large blood clots in your stool.  Your abdomen is swollen.  You have nausea or vomiting.  You have a fever.  You have increasing abdominal pain that is not relieved with medicine. This information is not intended to replace advice given to you by your health care provider. Make sure you discuss any questions you have with your health care provider. Document Released: 02/14/2004 Document Revised: 03/26/2016 Document Reviewed: 09/13/2015 Elsevier Interactive Patient Education  2017 [place].   Colon Polyps Introduction Polyps are tissue growths inside the body. Polyps can grow in many places, including the large intestine (colon). A polyp may be a round bump or a mushroom-shaped growth. You could have one polyp or several. Most colon polyps are noncancerous (benign). However, some colon polyps can become cancerous over time. What are the causes? The exact cause of colon polyps is not known. What increases the risk? This condition is more likely to develop in people who:  Have a family history of colon cancer or colon polyps.  Are older than 47 or older than 45 if they are African American.  Have inflammatory bowel disease, such as ulcerative colitis or Crohn disease.  Are overweight.  Smoke cigarettes.  Do not get enough exercise.  Drink too much alcohol.  Eat a diet that is:  High in fat and red meat.  Low in fiber.  Had childhood cancer that was treated with abdominal radiation. What are the signs or symptoms? Most polyps do not cause symptoms. If you have symptoms, they may include:  Blood coming from your rectum  when having a bowel movement.  Blood in your stool.The stool may look dark red or black.  A change in bowel habits, such as constipation or diarrhea. How is this diagnosed? This condition is diagnosed with a colonoscopy. This is a procedure that uses a lighted, flexible scope to look at the inside of your colon. How is this treated? Treatment for this condition involves removing any polyps that are found. Those polyps will then be tested for cancer. If cancer is found, your health care provider will talk to you about options for colon cancer treatment. Follow these instructions at home: Diet  Eat plenty of fiber, such as fruits, vegetables, and whole grains.  Eat foods that are high in calcium and vitamin D, such as milk, cheese, yogurt, eggs, liver, fish, and broccoli.  Limit foods high in fat, red meats, and processed meats, such as hot dogs, sausage, bacon, and lunch meats.  Maintain a healthy weight, or lose weight if recommended by your health care provider. General instructions  Do not smoke cigarettes.  Do not drink alcohol excessively.  Keep all follow-up visits as told by your health  care provider. This is important. This includes keeping regularly scheduled colonoscopies. Talk to your health care provider about when you need a colonoscopy.  Exercise every day or as told by your health care provider. Contact a health care provider if:  You have new or worsening bleeding during a bowel movement.  You have new or increased blood in your stool.  You have a change in bowel habits.  You unexpectedly lose weight. This information is not intended to replace advice given to you by your health care provider. Make sure you discuss any questions you have with your health care provider. Document Released: 03/28/2004 Document Revised: 12/08/2015 Document Reviewed: 05/23/2015  2017 Elsevier

## 2016-06-15 NOTE — Transfer of Care (Signed)
Immediate Anesthesia Transfer of Care Note  Patient: Noah Rodgers  Procedure(s) Performed: Procedure(s) with comments: COLONOSCOPY WITH PROPOFOL (N/A) - 855 ESOPHAGOGASTRODUODENOSCOPY (EGD) WITH PROPOFOL (N/A) ESOPHAGEAL DILATION (N/A) BIOPSY - esophageal bx's POLYPECTOMY - splenic flexure polyp, sigmoid colon polyp x2  Patient Location: PACU  Anesthesia Type:MAC  Level of Consciousness: awake  Airway & Oxygen Therapy: Patient Spontanous Breathing  Post-op Assessment: Report given to RN  Post vital signs: Reviewed and stable  Last Vitals:  Vitals:   06/15/16 0755 06/15/16 0800  BP: 134/89 134/82  Pulse:    Resp: 20 20  Temp:      Last Pain: There were no vitals filed for this visit.    Patients Stated Pain Goal: 0 ([id number])  Complications: No apparent anesthesia complications

## 2016-06-15 NOTE — Op Note (Addendum)
[facility] Patient Name: Noah Rodgers Procedure Date: 06/15/2016 8:55 AM MRN: [id number] Date of Birth: 02/26/1946 Attending MD: Hildred Laser , MD CSN: [id number] Age: 70 Admit Type: Outpatient Procedure:                Colonoscopy Indications:              High risk colon cancer surveillance: Personal                            history of colonic polyps Providers:                Hildred Laser, MD, Rosina Lowenstein, RN, Isabella Stalling, Technician Referring MD:             Laray Anger NP, NP Medicines:                Propofol per Anesthesia Complications:            No immediate complications. Estimated Blood Loss:     Estimated blood loss: none. Procedure:                Pre-Anesthesia Assessment:                           - Prior to the procedure, a History and Physical                            was performed, and patient medications and                            allergies were reviewed. The patient's tolerance of                            previous anesthesia was also reviewed. The risks                            and benefits of the procedure and the sedation                            options and risks were discussed with the patient.                            All questions were answered, and informed consent                            was obtained. Prior Anticoagulants: The patient                            last took aspirin 7 days prior to the procedure.                            ASA Grade Assessment: II - A patient with mild  systemic disease. After reviewing the risks and                            benefits, the patient was deemed in satisfactory                            condition to undergo the procedure.                           After obtaining informed consent, the colonoscope                            was passed under direct vision. Throughout the                            procedure, the patient's  blood pressure, pulse, and                            oxygen saturations were monitored continuously. The                            [id number]) was introduced through the                            anus and advanced to the the cecum, identified by                            appendiceal orifice and ileocecal valve. The                            colonoscopy was performed without difficulty. The                            patient tolerated the procedure well. The quality                            of the bowel preparation was good. The ileocecal                            valve, appendiceal orifice, and rectum were                            photographed. Scope In: 9:00:59 AM Scope Out: 9:19:03 AM Scope Withdrawal Time: 0 hours 14 minutes 29 seconds  Total Procedure Duration: 0 hours 18 minutes 4 seconds  Findings:      The perianal and digital rectal examinations were normal.      A 4 mm polyp was found in the splenic flexure. The polyp was sessile.       The polyp was removed with a cold snare. Resection and retrieval were       complete. The pathology specimen was placed into Bottle Number 2.      A 6 mm polyp was found in the sigmoid colon. The polyp was sessile. The       polyp was removed with a hot snare.  Resection and retrieval were       complete. The pathology specimen was placed into Bottle Number 2.      A 10 mm polyp was found in the sigmoid colon. The polyp was       pedunculated. The polyp was removed with a hot snare. Resection and       retrieval were complete. The pathology specimen was placed into Bottle       Number 3.      External hemorrhoids were found during retroflexion. The hemorrhoids       were medium-sized. Impression:               - One 4 mm polyp at the splenic flexure, removed                            with a cold snare. Resected and retrieved.                           - One 6 mm polyp in the sigmoid colon, removed with                             a hot snare. Resected and retrieved.                           - One 10 mm polyp in the sigmoid colon, removed                            with a hot snare. Resected and retrieved.                           - External hemorrhoids. Moderate Sedation:      Per Anesthesia Care Recommendation:           - Patient has a contact number available for                            emergencies. The signs and symptoms of potential                            delayed complications were discussed with the                            patient. Return to normal activities tomorrow.                            Written discharge instructions were provided to the                            patient.                           - Resume previous diet today.                           - Continue present medications.                           -  No aspirin, ibuprofen, naproxen, or other                            non-steroidal anti-inflammatory drugs for 7 days                            after polyp removal.                           - Await pathology results.                           - Repeat colonoscopy in 5 years for surveillance. Procedure Code(s):        --- Professional ---                           [phone], Colonoscopy, flexible; with removal of                            tumor(s), polyp(s), or other lesion(s) by snare                            technique Diagnosis Code(s):        --- Professional ---                           Z86.010, Personal history of colonic polyps                           D12.3, Benign neoplasm of transverse colon (hepatic                            flexure or splenic flexure)                           D12.5, Benign neoplasm of sigmoid colon                           K64.4, Residual hemorrhoidal skin tags CPT copyright 2016 American Medical Association. All rights reserved. The codes documented in this report are preliminary and upon coder review may  be revised to meet current  compliance requirements. Hildred Laser, MD Hildred Laser, MD 06/15/2016 9:35:51 AM This report has been signed electronically. Number of Addenda: 0

## 2016-06-20 ENCOUNTER — Encounter (HOSPITAL_COMMUNITY): Payer: Self-pay | Admitting: Internal Medicine

## 2016-07-11 ENCOUNTER — Telehealth (INDEPENDENT_AMBULATORY_CARE_PROVIDER_SITE_OTHER): Payer: Self-pay | Admitting: Internal Medicine

## 2016-07-11 NOTE — Telephone Encounter (Signed)
Patient's spouse, Santiago Glad called and stated that the patient is having a flare up, thinks it's diverticulitis.  She thinks he may need an antibiotic.  [phone]

## 2016-07-11 NOTE — Telephone Encounter (Signed)
Per Dr.Rehman the patient will need to see his PCP as this may a problem with his prostate. Patient's wife was called and made aware.

## 2016-07-11 NOTE — Telephone Encounter (Signed)
Patient called, spoke with his wife. Stomach pain , lower abdomen, afebrile , negative for N&V. Patient states that the pain is just like the one he has when he has the diverticulitis flare. However, he says that he is urinating more than usual. He c/o burning sometimes when he urinates.  To be addressed with Dr.Rehman.

## 2017-06-04 ENCOUNTER — Encounter (INDEPENDENT_AMBULATORY_CARE_PROVIDER_SITE_OTHER): Payer: Self-pay | Admitting: Internal Medicine

## 2017-06-04 ENCOUNTER — Ambulatory Visit (HOSPITAL_COMMUNITY)
Admission: RE | Admit: 2017-06-04 | Discharge: 2017-06-04 | Disposition: A | Discharge status: Home / Self Care | Payer: Medicare Other | Source: Ambulatory Visit | Attending: Internal Medicine | Admitting: Internal Medicine

## 2017-06-04 ENCOUNTER — Ambulatory Visit (INDEPENDENT_AMBULATORY_CARE_PROVIDER_SITE_OTHER): Payer: Medicare Other | Admitting: Internal Medicine

## 2017-06-04 VITALS — BP 144/80 | HR 60 | Temp 98.1°F | Ht 73.0 in | Wt 195.4 lb

## 2017-06-04 DIAGNOSIS — K5900 Constipation, unspecified: Secondary | ICD-10-CM | POA: Diagnosis not present

## 2017-06-04 NOTE — Patient Instructions (Signed)
LInzess Samples 121mcg. He will call with a PR report in 2 weeks.

## 2017-06-04 NOTE — Progress Notes (Signed)
Subjective:    Patient ID: Noah Rodgers, male    DOB: 1945/07/25, 71 y.o.   MRN: [id number]  HPI Present today with c/o constipation x 3 weeks. Feels like he needs to go, but he can't go.  When he does go, it is in small amounts. States has lost about 4-5 pounds in 2 weeks. Having a BM 1-2 times a day in small amt. Takes MOM, Amitiza, Doculax. Takes a Probiotic.   Has just been snacking.  Drinks some water.  No melena or BRRB.   Has not taking Hydrocodone in 3-4 weeks.  Has not drank in 2 weeks.  Would drink 4-5 beers when he drinks.     Underwent a colonoscopy in December of 2017 (high risk cancer surveillance, personal hx of colonic polyps)   Impression:               - One 4 mm polyp at the splenic flexure, removed                            with a cold snare. Resected and retrieved.                           - One 6 mm polyp in the sigmoid colon, removed with                            a hot snare. Resected and retrieved.                           - One 10 mm polyp in the sigmoid colon,  removed                            with a hot snare. Resected and retrieved.                           - External hemorrhoids. Biopsy: tubular adenoma. Next colonoscopy in 5 yrs.  EGD 06/15/2016 : Normal. Esohagus dilated with 56 Maloney dilators.     Review of Systems Past Medical History:  Diagnosis Date  . Anxiety   . Arthritis   . Asthma   . GERD (gastroesophageal reflux disease)   . Hypercholesteremia   . Hypertension     Past Surgical History:  Procedure Laterality Date  . BIOPSY  06/15/2016   Procedure: BIOPSY;  Surgeon: Rogene Houston, MD;  [facility];  Service: Endoscopy;;  esophageal bx's  . COLONOSCOPY     6 years ago in [place]  . COLONOSCOPY WITH PROPOFOL N/A 06/15/2016   Procedure: COLONOSCOPY WITH PROPOFOL;  Surgeon: Rogene Houston, MD;  [facility];  Service: Endoscopy;  Laterality: N/A;  855  . ESOPHAGEAL DILATION N/A 06/15/2016     Procedure: ESOPHAGEAL DILATION;  Surgeon: Rogene Houston, MD;  [facility];  Service: Endoscopy;  Laterality: N/A;  . ESOPHAGOGASTRODUODENOSCOPY (EGD) WITH PROPOFOL N/A 06/15/2016   Procedure: ESOPHAGOGASTRODUODENOSCOPY (EGD) WITH PROPOFOL;  Surgeon: Rogene Houston, MD;  [facility];  Service: Endoscopy;  Laterality: N/A;  . HERNIA REPAIR Right    Inguinal  . POLYPECTOMY  06/15/2016   Procedure: POLYPECTOMY;  Surgeon: Rogene Houston, MD;  [facility];  Service: Endoscopy;;  splenic flexure polyp, sigmoid colon polyp x2  . SEPTOPLASTY    . SHOULDER ARTHROSCOPY WITH LABRAL REPAIR Left   . UPPER GASTROINTESTINAL ENDOSCOPY      No Known Allergies  Current Outpatient Medications on File Prior to Visit  Medication Sig Dispense Refill  . amlodipine-olmesartan (AZOR) 10-20 MG tablet Take 1 tablet by mouth daily.    Marland Kitchen aspirin 81 MG tablet Take 1 tablet (81 mg total) by mouth daily. 30 tablet   . bisacodyl (BISACODYL) 5 MG EC tablet Take by mouth daily as needed for moderate constipation.    . Cholecalciferol (VITAMIN D PO) Take 1 tablet by mouth daily.    Marland Kitchen HYDROcodone-acetaminophen (NORCO/VICODIN) 5-325 MG tablet Take 1 tablet by mouth daily as needed for moderate pain.    . hydroxychloroquine (PLAQUENIL) 200 MG tablet Take by mouth daily.    Marland Kitchen losartan (COZAAR) 50 MG tablet Take 50 mg by mouth daily.    . pantoprazole (PROTONIX) 40 MG tablet Take 40 mg by mouth daily.    . pravastatin (PRAVACHOL) 10 MG tablet Take 10 mg by mouth every evening.    . predniSONE (DELTASONE) 5 MG tablet Take 5 mg by mouth daily with breakfast. Take 15mg  till 06/15/16     . lansoprazole (PREVACID) 30 MG capsule Take 30 mg by mouth daily.     . Misc Natural Products (PROSTATE SUPPORT PO) Take 1 tablet by mouth daily.     . montelukast (SINGULAIR) 10 MG tablet Take 10 mg by mouth at bedtime as needed (breathing problems).      No current facility-administered medications on file  prior to visit.         Objective:   Physical Exam Blood pressure (!) 144/80, pulse 60, temperature 98.1 F (36.7 C), height 6\' 1"  (1.854 m), weight 195 lb 6.4 oz (88.6 kg). Alert and oriented. Skin warm and dry. Oral mucosa is moist.   . Sclera anicteric, conjunctivae is pink. Thyroid not enlarged. No cervical lymphadenopathy. Lungs clear. Heart regular rate and rhythm.  Abdomen is soft. Bowel sounds are positive. No hepatomegaly. No abdominal masses felt. No tenderness.  No edema to lower extremities.           Assessment & Plan:  Constipation: am going to start him on Linzess 132mcg daily.  KUB today He will let me know if this helps.

## 2017-06-10 ENCOUNTER — Telehealth (INDEPENDENT_AMBULATORY_CARE_PROVIDER_SITE_OTHER): Payer: Self-pay | Admitting: Internal Medicine

## 2017-06-10 MED ORDER — LINACLOTIDE 290 MCG PO CAPS: 290.0000 ug | ORAL_CAPSULE | Freq: Every day | ORAL | 3 refills | Status: DC

## 2017-06-10 NOTE — Telephone Encounter (Signed)
Rx for Linzess 290 sent to his pharmacy

## 2017-06-26 ENCOUNTER — Other Ambulatory Visit (INDEPENDENT_AMBULATORY_CARE_PROVIDER_SITE_OTHER): Payer: Self-pay | Admitting: Internal Medicine

## 2017-06-26 DIAGNOSIS — K5909 Other constipation: Secondary | ICD-10-CM

## 2017-06-26 MED ORDER — LINACLOTIDE 145 MCG PO CAPS: 145.0000 ug | ORAL_CAPSULE | Freq: Every day | ORAL | 3 refills | Status: DC

## 2017-06-26 NOTE — Progress Notes (Signed)
er

## 2018-02-20 ENCOUNTER — Ambulatory Visit (INDEPENDENT_AMBULATORY_CARE_PROVIDER_SITE_OTHER): Payer: Medicare Other | Admitting: Internal Medicine

## 2018-02-20 ENCOUNTER — Encounter (INDEPENDENT_AMBULATORY_CARE_PROVIDER_SITE_OTHER): Payer: Self-pay | Admitting: Internal Medicine

## 2018-02-20 ENCOUNTER — Encounter (INDEPENDENT_AMBULATORY_CARE_PROVIDER_SITE_OTHER): Payer: Self-pay | Admitting: *Deleted

## 2018-02-20 VITALS — BP 152/86 | HR 60 | Temp 97.7°F | Ht 73.0 in | Wt 191.3 lb

## 2018-02-20 DIAGNOSIS — R110 Nausea: Secondary | ICD-10-CM

## 2018-02-20 DIAGNOSIS — M25552 Pain in left hip: Secondary | ICD-10-CM

## 2018-02-20 DIAGNOSIS — M25551 Pain in right hip: Secondary | ICD-10-CM | POA: Insufficient documentation

## 2018-02-20 LAB — COMPREHENSIVE METABOLIC PANEL
AG RATIO: 1.6 (calc) (ref 1.0–2.5)
ALKALINE PHOSPHATASE (APISO): 67 U/L (ref 40–115)
ALT: 20 U/L (ref 9–46)
AST: 20 U/L (ref 10–35)
Albumin: 4.4 g/dL (ref 3.6–5.1)
BILIRUBIN TOTAL: 0.9 mg/dL (ref 0.2–1.2)
BUN: 14 mg/dL (ref 7–25)
CALCIUM: 9.7 mg/dL (ref 8.6–10.3)
CO2: 30 mmol/L (ref 20–32)
Chloride: 105 mmol/L (ref 98–110)
Creat: 0.83 mg/dL (ref 0.70–1.25)
Globulin: 2.7 g/dL (calc) (ref 1.9–3.7)
Glucose, Bld: 90 mg/dL (ref 65–139)
Potassium: 3.6 mmol/L (ref 3.5–5.3)
SODIUM: 142 mmol/L (ref 135–146)
Total Protein: 7.1 g/dL (ref 6.1–8.1)

## 2018-02-20 LAB — CBC WITH DIFFERENTIAL/PLATELET
BASOS ABS: 31 {cells}/uL (ref 0–200)
Basophils Relative: 0.5 %
EOS ABS: 128 {cells}/uL (ref 15–500)
Eosinophils Relative: 2.1 %
HEMATOCRIT: 41.4 % (ref 38.5–50.0)
HEMOGLOBIN: 14.5 g/dL (ref 13.2–17.1)
LYMPHS ABS: 1592 {cells}/uL (ref 850–3900)
MCH: 31.9 pg (ref 27.0–33.0)
MCHC: 35 g/dL (ref 32.0–36.0)
MCV: 91 fL (ref 80.0–100.0)
MPV: 9.9 fL (ref 7.5–12.5)
Monocytes Relative: 7.9 %
NEUTROS ABS: 3867 {cells}/uL (ref 1500–7800)
NEUTROS PCT: 63.4 %
Platelets: 171 10*3/uL (ref 140–400)
RBC: 4.55 10*6/uL (ref 4.20–5.80)
RDW: 12.6 % (ref 11.0–15.0)
Total Lymphocyte: 26.1 %
WBC mixed population: 482 cells/uL (ref 200–950)
WBC: 6.1 10*3/uL (ref 3.8–10.8)

## 2018-02-20 MED ORDER — ONDANSETRON HCL 4 MG PO TABS: 4.0000 mg | ORAL_TABLET | Freq: Three times a day (TID) | ORAL | 1 refills | Status: DC | PRN

## 2018-02-20 NOTE — Progress Notes (Signed)
   Subjective:    Patient ID: Noah Rodgers, male    DOB: 1946-02-11, 72 y.o.   MRN: [id number]  HPI Here today for f/u. Last seen in November of 2018 for constipation. Presents today with c/o nausea which comes and goes. Takes the Protonix daily.  At night he has to cough some mucous up. States his throat stays sore. With the nausea, he will snack which helps. He eats a lot of junk food.  He ha lost 4 pounds since his last visit. His appetite is okay. Doesn't want to eat because of the nausea. BMs are moving okay.  He takes Zantac at night as needed. Has tried drinking beer to see if the nausea would resolve.    Underwent a colonoscopy in December of 2017 (high risk cancer surveillance, personal hx of colonic polyps)   Impression: - One 4 mm polyp at the splenic flexure, removed  with a cold snare. Resected and retrieved. - One 6 mm polyp in the sigmoid colon, removed with  a hot snare. Resected and retrieved. - One 10 mm polyp in the sigmoid colon,  removed  with a hot snare. Resected and retrieved. - External hemorrhoids. Biopsy: tubular adenoma. Next colonoscopy in 5 yrs.  EGD 06/15/2016 : Normal. Esohagus dilated with 56 Maloney dilators.   Review of Systems     Objective:   Physical Exam Blood pressure (!) 152/86, pulse 60, temperature 97.7 F (36.5 C), height 6\' 1"  (1.854 m), weight 191 lb 4.8 oz (86.8 kg). Alert and oriented. Skin warm and dry. Oral mucosa is moist.   . Sclera anicteric, conjunctivae is pink. Thyroid not enlarged. No cervical lymphadenopathy. Lungs clear. Heart regular rate and rhythm.  Abdomen is soft. Bowel sounds are positive. No hepatomegaly. No abdominal masses felt. No tenderness.  No edema to lower extremities.          Assessment & Plan:  Nausea. CBC, CMET. US abdomen. Rx  for Zofran sent to his pharmacy. Further recommendations to follow.

## 2018-02-20 NOTE — Patient Instructions (Addendum)
Protonix in am 30 minutes before breakfast. Zantac at night. Labs today. US abdomen.

## 2018-02-25 ENCOUNTER — Ambulatory Visit (HOSPITAL_COMMUNITY)
Admission: RE | Admit: 2018-02-25 | Discharge: 2018-02-25 | Disposition: A | Discharge status: Home / Self Care | Payer: Medicare Other | Source: Ambulatory Visit | Attending: Internal Medicine | Admitting: Internal Medicine

## 2018-02-25 DIAGNOSIS — R110 Nausea: Secondary | ICD-10-CM | POA: Diagnosis not present

## 2018-12-22 ENCOUNTER — Other Ambulatory Visit (INDEPENDENT_AMBULATORY_CARE_PROVIDER_SITE_OTHER): Payer: Self-pay | Admitting: Internal Medicine

## 2018-12-22 DIAGNOSIS — R110 Nausea: Secondary | ICD-10-CM

## 2020-01-02 IMAGING — US US ABDOMEN COMPLETE
1 series · 14 of 25 positions shown · non-contrast
Comparison: None.

CLINICAL DATA: Epigastric abdominal pain for 6 months.

EXAM:
ABDOMEN ULTRASOUND COMPLETE

[Series 1: us abdomen complete · 0.16mm/px · 14 of 93 slices shown]
[im 1/93]
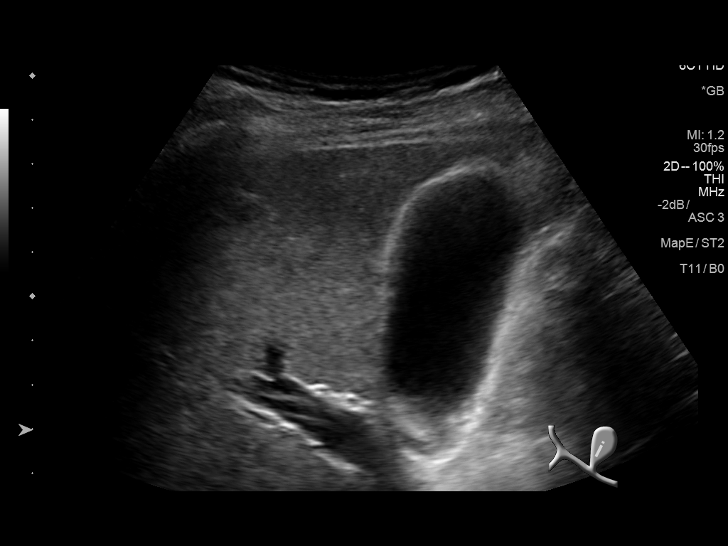
[im 8/93]
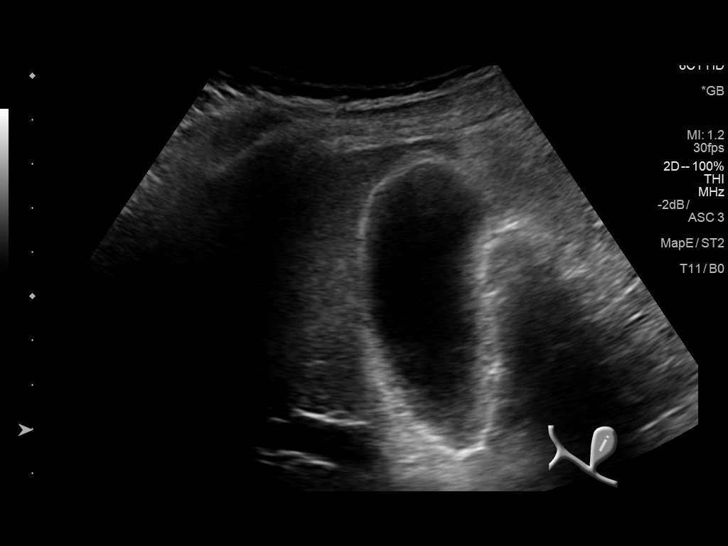
[im 16/93]
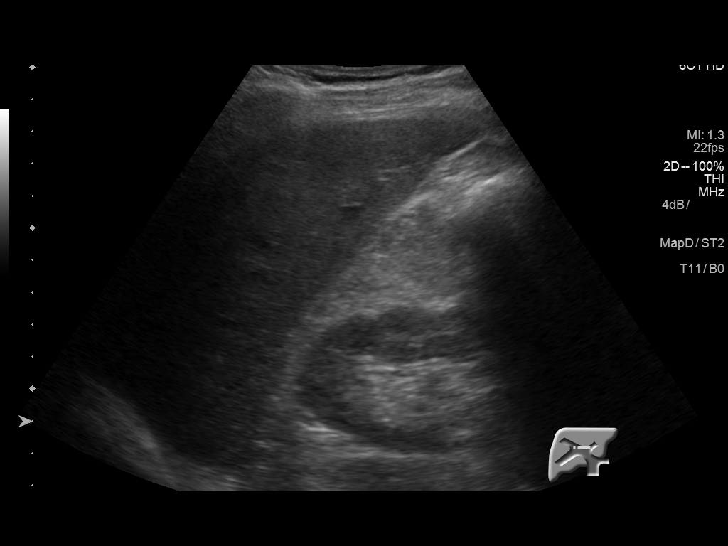
[im 24/93]
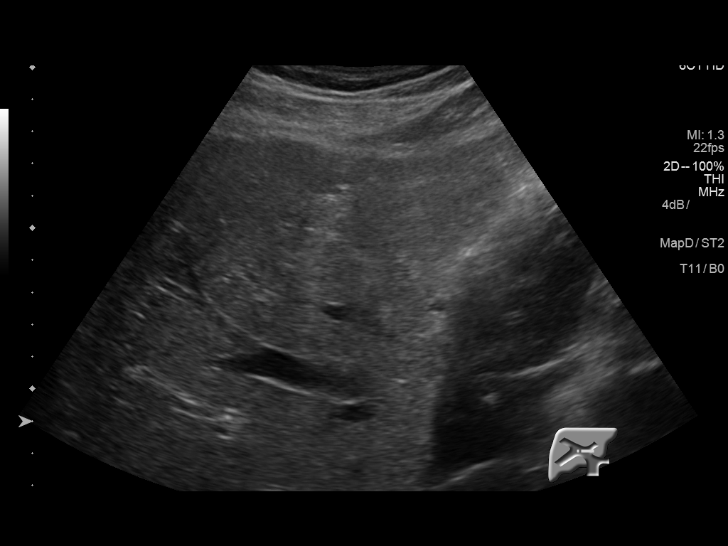
[im 31/93]
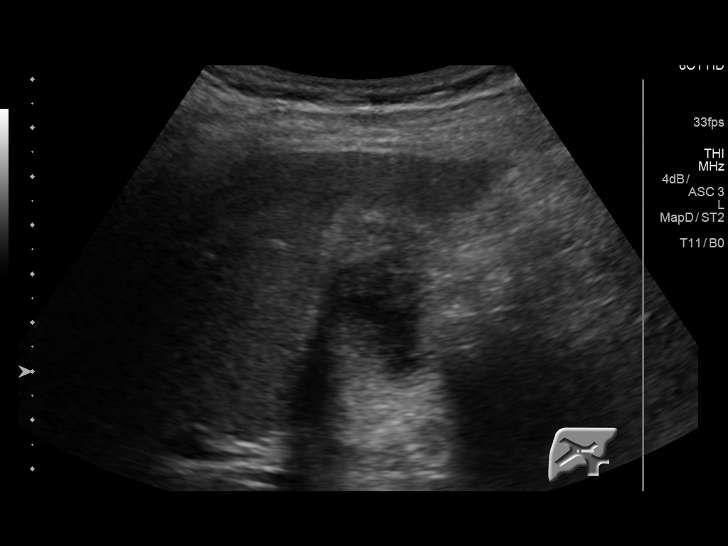
[im 35/93]
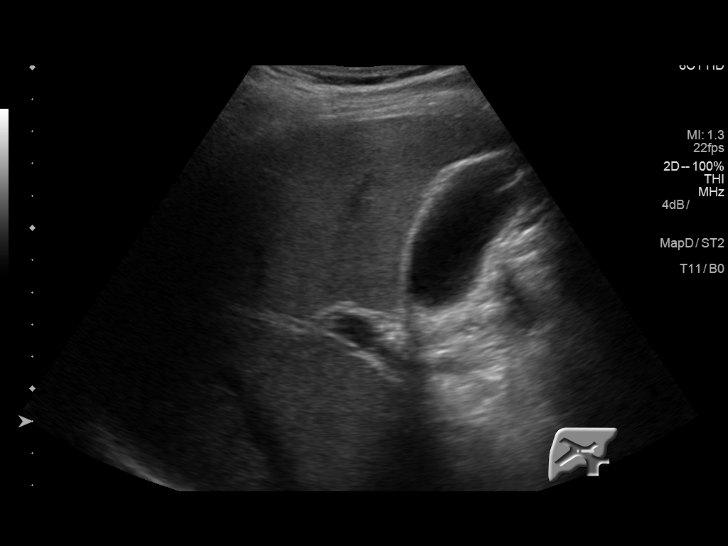
[im 43/93]
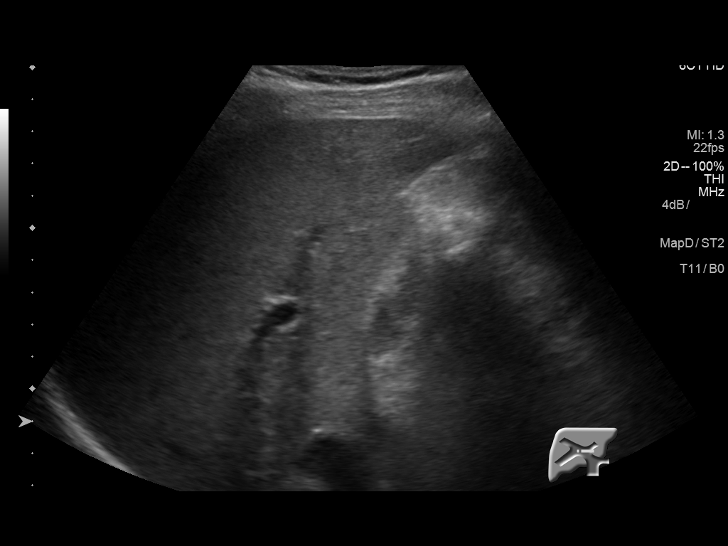
[im 50/93]
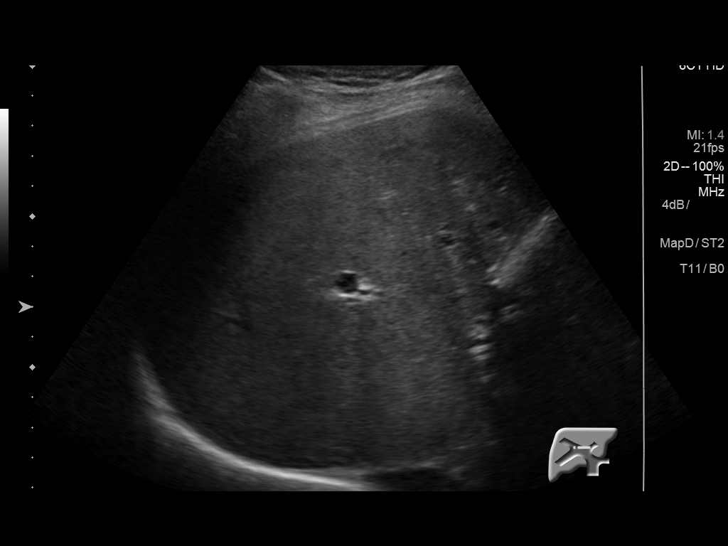
[im 58/93]
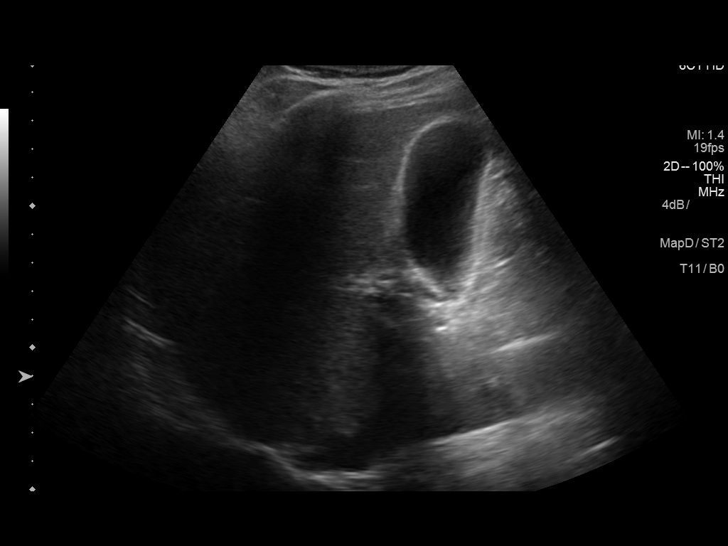
[im 62/93]
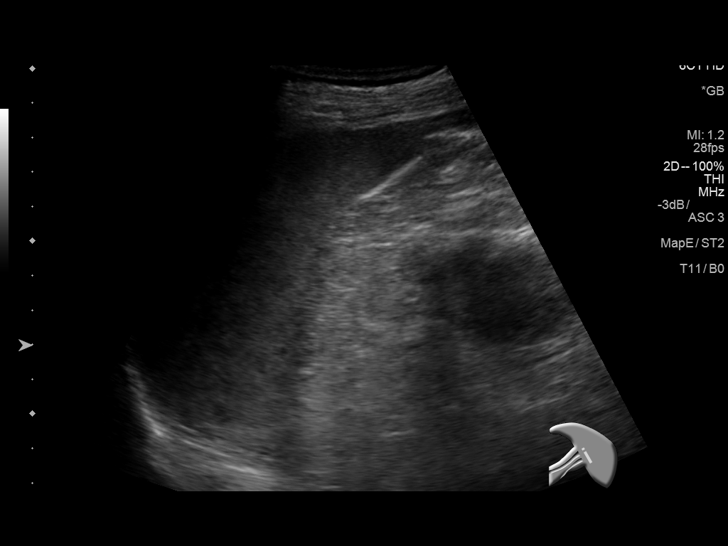
[im 70/93]
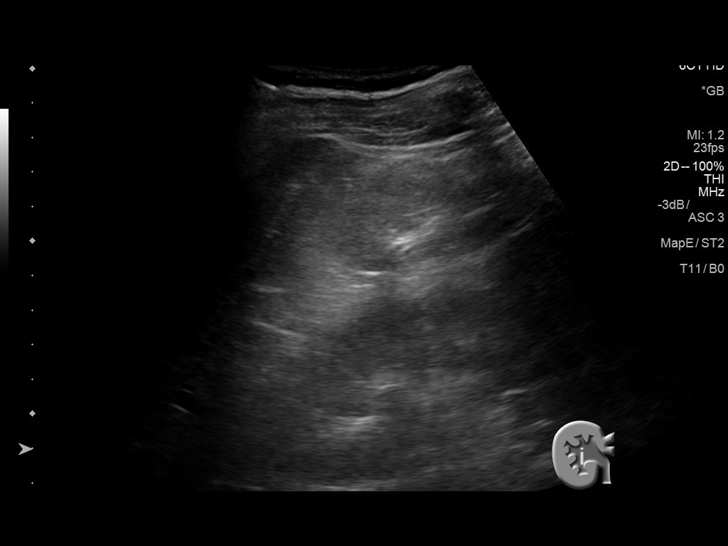
[im 77/93]
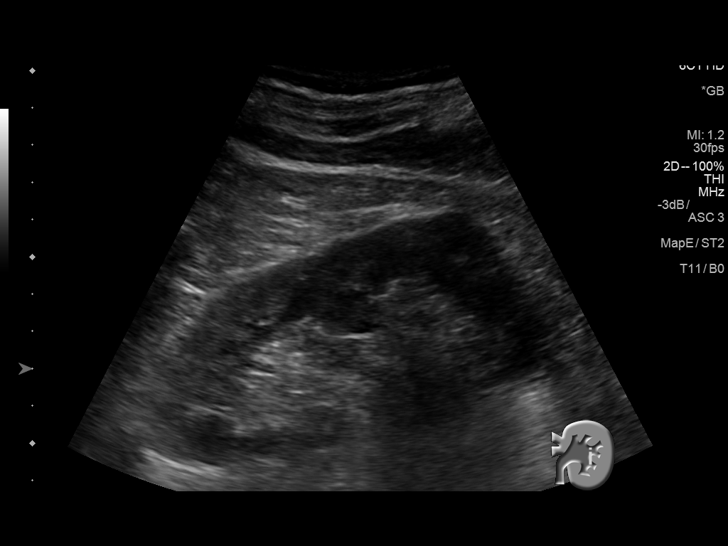
[im 85/93]
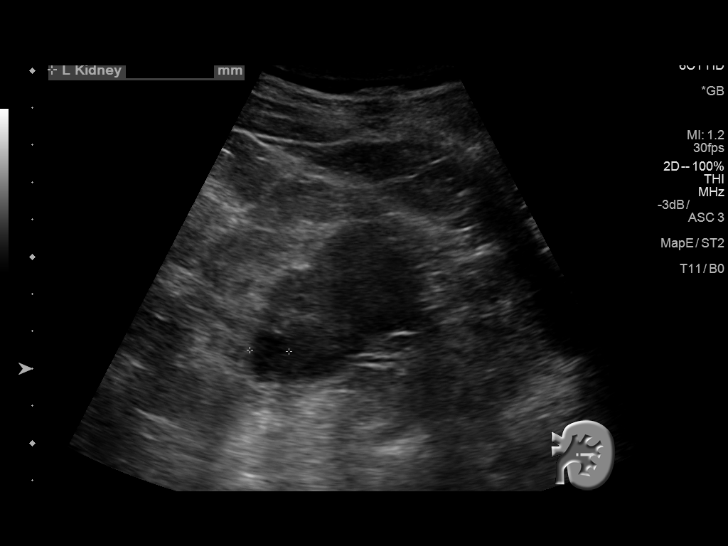
[im 93/93]
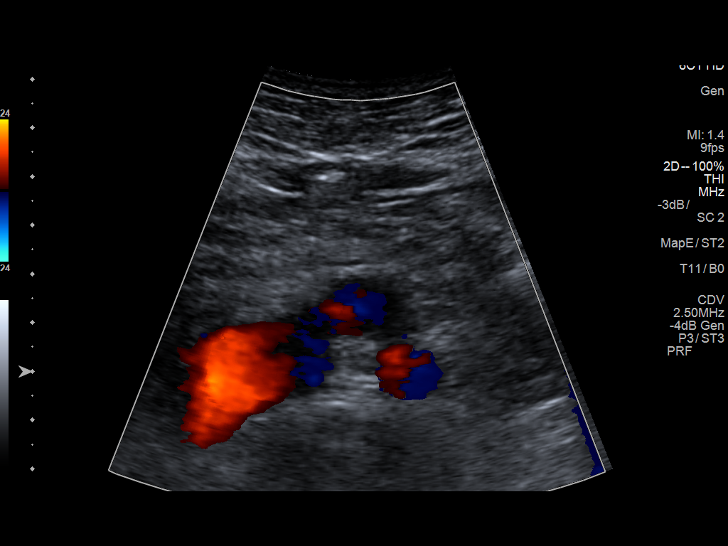

[14 of 25 positions shown; findings below may reference images not displayed]

FINDINGS: Gallbladder: No gallstones or wall thickening visualized. No
sonographic Murphy sign noted by sonographer.

Common bile duct: Diameter: 5.6 mm which is within normal limits.

Liver: No focal lesion identified. Within normal limits in
parenchymal echogenicity. Portal vein is patent on color Doppler
imaging with normal direction of blood flow towards the liver.

IVC: No abnormality visualized.

Pancreas: Visualized portion unremarkable.

Spleen: Size and appearance within normal limits.

Right Kidney: Length: 12.6 cm. Echogenicity within normal limits. No
mass or hydronephrosis visualized.

Left Kidney: Length: 10.6 cm. 1.3 cm simple cyst is seen in lower
pole. Echogenicity within normal limits. No mass or hydronephrosis
visualized.

Abdominal aorta: No aneurysm visualized.

Other findings: None.
IMPRESSION: No significant abnormality seen in the abdomen.

## 2021-02-09 ENCOUNTER — Encounter (INDEPENDENT_AMBULATORY_CARE_PROVIDER_SITE_OTHER): Payer: Self-pay | Admitting: Gastroenterology

## 2021-02-09 ENCOUNTER — Other Ambulatory Visit: Payer: Self-pay

## 2021-02-09 ENCOUNTER — Ambulatory Visit (INDEPENDENT_AMBULATORY_CARE_PROVIDER_SITE_OTHER): Payer: Medicare Other | Admitting: Gastroenterology

## 2021-02-09 DIAGNOSIS — R110 Nausea: Secondary | ICD-10-CM | POA: Diagnosis not present

## 2021-02-09 DIAGNOSIS — M940 Chondrocostal junction syndrome [Tietze]: Secondary | ICD-10-CM

## 2021-02-09 MED ORDER — NAPROXEN 500 MG PO TABS: 500.0000 mg | ORAL_TABLET | Freq: Two times a day (BID) | ORAL | 0 refills | Status: AC

## 2021-02-09 MED ORDER — ONDANSETRON HCL 4 MG PO TABS: 4.0000 mg | ORAL_TABLET | Freq: Three times a day (TID) | ORAL | 0 refills | Status: DC | PRN

## 2021-02-09 NOTE — Patient Instructions (Signed)
-  I suspect your pain is likely due to a pulled muscle, we will send naproxen for you to take twice daily for the next two weeks. -We will also send in zofran for nausea that you can take every 8 hour as needed.   If symptoms do not resolve, we may need to proceed with an EGD, please call the office to let us know how you are doing in the next 2-3 weeks. -Colonoscopy will be due in December.  It was a pleasure caring for you today!

## 2021-02-09 NOTE — Progress Notes (Signed)
Referring Provider: Thea Alken, FNP Primary Care Physician:  Thea Alken Primary GI Physician: Dr. Jenetta Downer  Chief Complaint  Patient presents with   Nausea    Nausea for about one and a half weeks ago and some pain in mid chest that comes and goes. Saw cardiologist on July 13th. Has stress test scheduled for next Wednesday. Sees Dr. Gibson Ramp in [place]. Has 2 stools a day, eats small amounts. Has lost some weight over the past 2 years about 20 lbs.    HPI:   Noah Rodgers is a 75 y.o. male presenting today with a history of anxiety, arthritis, asthma, GERD, HTN and Hypercholesteremia. Patient presents today for follow up of GERD and new symptoms of nausea.  Patient states that he ate at a [place] restaurant 1.5 weeks ago, ate a heavy meal for lunch then went and tried on some boots. Reports he was bent over quite a bit while trying them on and began having some epigastric pain and nausea. He denies any episodes of vomiting. Reports pain last about a day. Reports that he has had some ongoing nausea since the episode of epigastric pain. Last episode was Monday.   Takes advil 1-2x per week. Takes '81mg'$  aspirin daily. He denies melena or blood in his stools. No significant weight loss. Denies any early satiety or bloating.  GERD: taking protonix daily 30 minutes prior to breakfast, doing well and states that he rarely has any breakthrough reflux. He denies any odynophagia. States that he has some "tightness" in his throat but denies any episodes of dysphagia.   Drinks a couple of beers per day, most weeks. Denies tobacco use.  Filed Weights   02/09/21 1117  Weight: 182 lb (82.6 kg)   Last Colonoscopy: dec 2017 (high risk surveillance due to hx of colonic polyps)- One 4 mm polyp at the splenic flexure - One 6 mm polyp in the sigmoid colon - One 10 mm polyp in the sigmoid colon Biopsy: tubular adenoma - External hemorrhoids.  Last Endoscopy:dec 2017 Normal  esophagus. - Z-line irregular, 43 cm from the incisors. - No endoscopic esophageal abnormality to explain patient's dysphagia.  Esophagus dilated. - Normal stomach. - Normal duodenal bulb and second portion of the duodenum. -reflux esophagitis  Recommendations:  Repeat high risk surveillance colonoscopy in 2022 r/t hx of tubular adenoma  Past Medical History:  Diagnosis Date   Anxiety    Arthritis    Asthma    Bilateral hip pain    GERD (gastroesophageal reflux disease)    Hypercholesteremia    Hypertension     Past Surgical History:  Procedure Laterality Date   BIOPSY  06/15/2016   Procedure: BIOPSY;  Surgeon: Rogene Houston, MD;  [facility];  Service: Endoscopy;;  esophageal bx's   COLONOSCOPY     6 years ago in [place] N/A 06/15/2016   Procedure: COLONOSCOPY WITH PROPOFOL;  Surgeon: Rogene Houston, MD;  [facility];  Service: Endoscopy;  Laterality: N/A;  [place] N/A 06/15/2016   Procedure: ESOPHAGEAL DILATION;  Surgeon: Rogene Houston, MD;  [facility];  Service: Endoscopy;  Laterality: N/A;   ESOPHAGOGASTRODUODENOSCOPY (EGD) WITH PROPOFOL N/A 06/15/2016   Procedure: ESOPHAGOGASTRODUODENOSCOPY (EGD) WITH PROPOFOL;  Surgeon: Rogene Houston, MD;  [facility];  Service: Endoscopy;  Laterality: N/A;   HERNIA REPAIR Right    Inguinal   POLYPECTOMY  06/15/2016  Procedure: POLYPECTOMY;  Surgeon: Rogene Houston, MD;  [facility];  Service: Endoscopy;;  splenic flexure polyp, sigmoid colon polyp x2   SEPTOPLASTY     SHOULDER ARTHROSCOPY WITH LABRAL REPAIR Left    UPPER GASTROINTESTINAL ENDOSCOPY      Current Outpatient Medications  Medication Sig Dispense Refill   amlodipine-olmesartan (AZOR) 10-20 MG tablet Take 1 tablet by mouth daily.     aspirin 81 MG tablet Take 1 tablet (81 mg total) by mouth daily. 30 tablet    Cholecalciferol (VITAMIN D PO) Take 1 tablet by  mouth daily.     Misc Natural Products (PROSTATE SUPPORT PO) Take 1 tablet by mouth daily.      Misc Natural Products (PROSTATE SUPPORT PO) Take by mouth.     pantoprazole (PROTONIX) 40 MG tablet Take 40 mg by mouth daily.     pravastatin (PRAVACHOL) 10 MG tablet Take 10 mg by mouth every evening.     ondansetron (ZOFRAN) 4 MG tablet TAKE 1 TABLET BY MOUTH EVERY 8 HOURS AS NEEDED FOR NAUSEA AND VOMITING (Patient not taking: Reported on 02/09/2021) 30 tablet 1   predniSONE (DELTASONE) 5 MG tablet Take 5 mg by mouth. Every other day (Patient not taking: Reported on 02/09/2021)     No current facility-administered medications for this visit.    Allergies as of 02/09/2021   (No Known Allergies)    Family History  Problem Relation Age of Onset   Arthritis Mother    Kidney disease Mother    Hypertension Father    Healthy Sister    Diabetes Brother    Colitis Son    Healthy Paternal Grandfather     Social History   Socioeconomic History   Marital status: Married    Spouse name: Not on file   Number of children: Not on file   Years of education: Not on file   Highest education level: Not on file  Occupational History   Not on file  Tobacco Use   Smoking status: Former    Packs/day: 0.50    Years: 2.00    Pack years: 1.00    Types: Cigarettes    Quit date: 10/17/1968    Years since quitting: 52.3   Smokeless tobacco: Never  Substance and Sexual Activity   Alcohol use: Yes    Comment: 2 -3 beers a day.   Drug use: No   Sexual activity: Yes    Birth control/protection: None  Other Topics Concern   Not on file  Social History Narrative   Not on file   Social Determinants of Health   Financial Resource Strain: Not on file  Food Insecurity: Not on file  Transportation Needs: Not on file  Physical Activity: Not on file  Stress: Not on file  Social Connections: Not on file   Review of Systems: Gen: Denies fever, chills, anorexia. Denies fatigue, weakness, weight loss.   CV: Denies chest pain, palpitations, syncope, peripheral edema, and claudication. Resp: Denies dyspnea at rest, cough, wheezing, coughing up blood, and pleurisy. GI: Denies vomiting blood, jaundice, and fecal incontinence. Denies dysphagia or odynophagia. Endorses epigastric "soreness" and nausea. Derm: Denies rash, itching, dry skin Psych: Denies depression, anxiety, memory loss, confusion. No homicidal or suicidal ideation.  Heme: Denies bruising, bleeding, and enlarged lymph nodes.  Physical Exam: BP (!) 144/74 (BP Location: Right Arm, Patient Position: Sitting, Cuff Size: Large)   Pulse 63   Temp 98 F (36.7 C) (Oral)   Ht '6\' 1"'$  (  1.854 m)   Wt 182 lb (82.6 kg)   BMI 24.01 kg/m  General:   Alert and oriented. No distress noted. Pleasant and cooperative.  Head:  Normocephalic and atraumatic. Heart: Normal rate and rhythm, s1 and s2 heart sounds present.  Lungs: Clear lung sounds in all lobes. Respirations equal and unlabored. Abdomen:  +BS, soft,  and non-distended. No rebound or guarding. No HSM or masses noted. Mild TTP of epigastric area Derm: No palmar erythema or jaundice Msk:  Symmetrical without gross deformities. Normal posture. Mild TTP mid sternal area. Extremities:  Without edema. Neurologic:  Alert and  oriented x4 Psych:  Alert and cooperative. Normal mood and affect.  ASSESSMENT: Noah Rodgers is a 75 y.o. male presenting today for follow up of GERD and new onset nausea without vomiting as well as epigastric pain that began 1.5 weeks ago.  Patient reports he is doing well on protonix '40mg'$  once daily. Denies any breakthrough reflux symptoms, dysphagia, or odynophagia. Will continue with current PPI regimen.  New onset of nausea and epigastric pain after eating heavy, spicy meal and bending over to try on shoes for approx 1 hour. Reports that epigastric pain resolved the next day, however, he c/o ongoing epigastric/sternal soreness and nausea without vomiting. Has  taken some nausea medication that he had at home with relief. He denies any changes in his appetite, or weight. No melena or BRBPR. Tenderness upon palpation to sternum, suspect pain is related to costochondritis. No frequent NSAID use. Suspect symptoms are likely related to costochondritis given patient's physical exam and history. Will try 2 week course of naproxen and provide zofran as needed for nausea. Patient to contact our office in 2-3 weeks to let us know if symptoms have improved. May need to proceed with further diagnostics such as EGD if nausea persists. Low suspicion for any acute GI processes given acute onset of symptoms after precipitating event.   Will plan for colonoscopy in December.  PLAN:  Continue protonix '40mg'$  daily 2. Take zofran '4mg'$  as needed for nausea every 8 hours 3. Complete 2 week course of 500 mg Naproxen BID for suspected costochondritis 4. Patient to contact our office in 2-3 weeks to report if symptoms have improved 5. Consider proceeding with further evaluation such as EGD if nausea and pain persists.   Follow Up: As needed  Case discussed with Dr. Jenetta Downer who is in agreement with plan of care as outline above.   Adaeze Better L. Alver Sorrow, MSN, APRN, AGNP-C Adult-Gerontology Nurse Practitioner [facility]

## 2021-02-13 ENCOUNTER — Telehealth (INDEPENDENT_AMBULATORY_CARE_PROVIDER_SITE_OTHER): Payer: Self-pay

## 2021-02-13 NOTE — Telephone Encounter (Signed)
Patient wife called today stating they normally see Dr. Laural Golden and patient was seen on 02/09/2021. He is no better per the wife, he still has nausea and zofran is not helping. They feel the diagnosis of a pulled muscle was ridiculous and this was a wasted trip here. Please advise.

## 2021-02-14 NOTE — Telephone Encounter (Signed)
Per [facility] please set up EGD with Dr. Laural Golden. Thanks

## 2021-02-16 ENCOUNTER — Other Ambulatory Visit (INDEPENDENT_AMBULATORY_CARE_PROVIDER_SITE_OTHER): Payer: Self-pay

## 2021-02-16 ENCOUNTER — Encounter (INDEPENDENT_AMBULATORY_CARE_PROVIDER_SITE_OTHER): Payer: Self-pay

## 2021-02-16 DIAGNOSIS — R110 Nausea: Secondary | ICD-10-CM

## 2021-02-23 ENCOUNTER — Telehealth (INDEPENDENT_AMBULATORY_CARE_PROVIDER_SITE_OTHER): Payer: Self-pay | Admitting: *Deleted

## 2021-02-23 MED ORDER — LINACLOTIDE 145 MCG PO CAPS: 145.0000 ug | ORAL_CAPSULE | Freq: Every day | ORAL | 0 refills | Status: DC | PRN

## 2021-02-23 NOTE — Telephone Encounter (Signed)
Pt requesting refill on linzess. States zofran causes constipation and linzess helps. Seen 02/09/21.m  [address] [place]   Pt's number [phone]

## 2021-02-23 NOTE — Telephone Encounter (Signed)
Pt notified on vm that rx was sent to pharm

## 2021-03-15 ENCOUNTER — Other Ambulatory Visit (INDEPENDENT_AMBULATORY_CARE_PROVIDER_SITE_OTHER): Payer: Self-pay | Admitting: Gastroenterology

## 2021-03-15 DIAGNOSIS — R110 Nausea: Secondary | ICD-10-CM

## 2021-03-30 ENCOUNTER — Other Ambulatory Visit: Payer: Self-pay

## 2021-03-30 ENCOUNTER — Encounter (HOSPITAL_COMMUNITY)
Admission: RE | Disposition: A | Discharge status: Home / Self Care | Payer: Self-pay | Source: Home / Self Care | Attending: Internal Medicine

## 2021-03-30 ENCOUNTER — Ambulatory Visit (HOSPITAL_COMMUNITY)
Admission: RE | Admit: 2021-03-30 | Discharge: 2021-03-30 | Disposition: A | Discharge status: Home / Self Care | Payer: Medicare Other | Attending: Internal Medicine | Admitting: Internal Medicine

## 2021-03-30 DIAGNOSIS — Z87891 Personal history of nicotine dependence: Secondary | ICD-10-CM | POA: Diagnosis not present

## 2021-03-30 DIAGNOSIS — K2980 Duodenitis without bleeding: Secondary | ICD-10-CM

## 2021-03-30 DIAGNOSIS — Z7982 Long term (current) use of aspirin: Secondary | ICD-10-CM | POA: Insufficient documentation

## 2021-03-30 DIAGNOSIS — Z79899 Other long term (current) drug therapy: Secondary | ICD-10-CM | POA: Diagnosis not present

## 2021-03-30 DIAGNOSIS — R634 Abnormal weight loss: Secondary | ICD-10-CM | POA: Diagnosis not present

## 2021-03-30 DIAGNOSIS — R110 Nausea: Secondary | ICD-10-CM | POA: Diagnosis present

## 2021-03-30 DIAGNOSIS — Z8719 Personal history of other diseases of the digestive system: Secondary | ICD-10-CM | POA: Diagnosis not present

## 2021-03-30 DIAGNOSIS — K2970 Gastritis, unspecified, without bleeding: Secondary | ICD-10-CM | POA: Diagnosis not present

## 2021-03-30 DIAGNOSIS — K2289 Other specified disease of esophagus: Secondary | ICD-10-CM | POA: Insufficient documentation

## 2021-03-30 HISTORY — PX: ESOPHAGOGASTRODUODENOSCOPY: SHX5428

## 2021-03-30 HISTORY — PX: BIOPSY: SHX5522

## 2021-03-30 LAB — SEDIMENTATION RATE: Sed Rate: 12 mm/hr (ref 0–16)

## 2021-03-30 LAB — CBC WITH DIFFERENTIAL/PLATELET
Abs Immature Granulocytes: 0 10*3/uL (ref 0.00–0.07)
Basophils Absolute: 0 10*3/uL (ref 0.0–0.1)
Basophils Relative: 1 %
Eosinophils Absolute: 0.1 10*3/uL (ref 0.0–0.5)
Eosinophils Relative: 2 %
HCT: 39 % (ref 39.0–52.0)
Hemoglobin: 13.3 g/dL (ref 13.0–17.0)
Immature Granulocytes: 0 %
Lymphocytes Relative: 20 %
Lymphs Abs: 0.8 10*3/uL (ref 0.7–4.0)
MCH: 32.6 pg (ref 26.0–34.0)
MCHC: 34.1 g/dL (ref 30.0–36.0)
MCV: 95.6 fL (ref 80.0–100.0)
Monocytes Absolute: 0.3 10*3/uL (ref 0.1–1.0)
Monocytes Relative: 7 %
Neutro Abs: 2.8 10*3/uL (ref 1.7–7.7)
Neutrophils Relative %: 70 %
Platelets: 125 10*3/uL — ABNORMAL LOW (ref 150–400)
RBC: 4.08 MIL/uL — ABNORMAL LOW (ref 4.22–5.81)
RDW: 12.6 % (ref 11.5–15.5)
WBC: 3.9 10*3/uL — ABNORMAL LOW (ref 4.0–10.5)
nRBC: 0 % (ref 0.0–0.2)

## 2021-03-30 LAB — COMPREHENSIVE METABOLIC PANEL
ALT: 16 U/L (ref 0–44)
AST: 18 U/L (ref 15–41)
Albumin: 4.1 g/dL (ref 3.5–5.0)
Alkaline Phosphatase: 61 U/L (ref 38–126)
Anion gap: 6 (ref 5–15)
BUN: 17 mg/dL (ref 8–23)
CO2: 25 mmol/L (ref 22–32)
Calcium: 8.4 mg/dL — ABNORMAL LOW (ref 8.9–10.3)
Chloride: 111 mmol/L (ref 98–111)
Creatinine, Ser: 0.68 mg/dL (ref 0.61–1.24)
GFR, Estimated: >60 mL/min (ref >60)
Glucose, Bld: 95 mg/dL (ref 70–99)
Potassium: 3.4 mmol/L — ABNORMAL LOW (ref 3.5–5.1)
Sodium: 142 mmol/L (ref 135–145)
Total Bilirubin: 0.9 mg/dL (ref 0.3–1.2)
Total Protein: 6.5 g/dL (ref 6.5–8.1)

## 2021-03-30 SURGERY — EGD (ESOPHAGOGASTRODUODENOSCOPY)
Anesthesia: Moderate Sedation

## 2021-03-30 MED ORDER — LIDOCAINE VISCOUS HCL 2 % MT SOLN
OROMUCOSAL | Status: DC | PRN
  Administered 2021-03-30: 1 via OROMUCOSAL

## 2021-03-30 MED ORDER — MIDAZOLAM HCL 5 MG/5ML IJ SOLN
INTRAMUSCULAR | Status: AC
  Filled 2021-03-30: qty 10

## 2021-03-30 MED ORDER — SODIUM CHLORIDE 0.9 % IV SOLN: INTRAVENOUS | Status: DC

## 2021-03-30 MED ORDER — MEPERIDINE HCL 50 MG/ML IJ SOLN
INTRAMUSCULAR | Status: AC
  Filled 2021-03-30: qty 1

## 2021-03-30 MED ORDER — LIDOCAINE VISCOUS HCL 2 % MT SOLN
OROMUCOSAL | Status: AC
  Filled 2021-03-30: qty 15

## 2021-03-30 MED ORDER — STERILE WATER FOR IRRIGATION IR SOLN
Status: DC | PRN
  Administered 2021-03-30: 100 mL

## 2021-03-30 MED ORDER — MIDAZOLAM HCL 5 MG/5ML IJ SOLN
INTRAMUSCULAR | Status: DC | PRN
  Administered 2021-03-30: 3 mg via INTRAVENOUS
  Administered 2021-03-30: 1 mg via INTRAVENOUS
  Administered 2021-03-30: 2 mg via INTRAVENOUS

## 2021-03-30 MED ORDER — MEPERIDINE HCL 50 MG/ML IJ SOLN
INTRAMUSCULAR | Status: DC | PRN
  Administered 2021-03-30 (×2): 25 mg

## 2021-03-30 NOTE — Discharge Instructions (Signed)
Resume aspirin on on 03/31/2021 Resume other medications and diet as before. No driving for 24 hours. Physician will call with results of biopsy and blood work and further recommendations.

## 2021-03-30 NOTE — H&P (Signed)
Noah Rodgers is an 75 y.o. male.   Chief Complaint: Patient is here for esophagogastroduodenoscopy. HPI: Patient is 75 year old Caucasian male who was seen in the office on 02/09/2021 for nausea and intermittent epigastric pain.  Symptoms are started 10 days earlier after he ate at a restaurant.  He was also complaining of chest pain and has been diagnosed with costochondritis by PCP and took 2 weeks of meloxicam.  He felt his illness could be foodborne in recommended observation for for few more weeks. While he is not having epigastric pain he remains with nausea.  Nausea is frequent but not every day.  He does not have good appetite.  He states he has lost 7 to 8 pounds since his symptoms began.  He denies melena or rectal bleeding.  He feels heartburn is well controlled with PPI.  He is not having chest pain anymore.  He denies dysphagia. He is on low-dose aspirin.  Last dose was yesterday.  He takes dual action Advil occasionally. He does not smoke cigarettes drinks 1-2 beers every day. He says he gets blood work at least once a year in the last time he had blood work was over 6 months ago.  Past Medical History:  Diagnosis Date   Anxiety    Arthritis    Asthma    Bilateral hip pain    GERD (gastroesophageal reflux disease)    Hypercholesteremia    Hypertension         Past Surgical History:  Procedure Laterality Date   BIOPSY  06/15/2016   Procedure: BIOPSY;  Surgeon: Rogene Houston, MD;  Location: [facility];  Service: Endoscopy;;  esophageal bx's   COLONOSCOPY     6 years ago in [place] N/A 06/15/2016   Noah Rodgers:(high risk surveillance due to hx of colonic polyps)-One 4 mm polyp at the splenic flexure   ESOPHAGEAL DILATION N/A 06/15/2016   Procedure: ESOPHAGEAL DILATION;  Surgeon: Rogene Houston, MD;  Location: [facility];  Service: Endoscopy;  Laterality: N/A;   ESOPHAGOGASTRODUODENOSCOPY (EGD) WITH PROPOFOL N/A 06/15/2016   Noah Rodgers:  Normal esophagus.- Z-line irregular, 43 cm from the incisors. No endoscopic esophageal abnormality to explain patient's dysphagia.esophagus dilated, normal stomach and duodenal bulb, second portion of duodenum. reflux esophagitis present   HERNIA REPAIR Right    Inguinal   POLYPECTOMY  06/15/2016   Procedure: POLYPECTOMY;  Surgeon: Rogene Houston, MD;  Location: [facility];  Service: Endoscopy;;  splenic flexure polyp, sigmoid colon polyp x2   SEPTOPLASTY     SHOULDER ARTHROSCOPY WITH LABRAL REPAIR Left    UPPER GASTROINTESTINAL ENDOSCOPY      Family History  Problem Relation Age of Onset   Arthritis Mother    Kidney disease Mother    Hypertension Father    Healthy Sister    Diabetes Brother    Colitis Son    Healthy Paternal Grandfather    Social History:  reports that he quit smoking about 52 years ago. His smoking use included cigarettes. He has a 1.00 pack-year smoking history. He has never used smokeless tobacco. He reports current alcohol use. He reports that he does not use drugs.  Allergies: No Known Allergies  Medications Prior to Admission  Medication Sig Dispense Refill   Acetylcarnitine HCl (ACETYL L-CARNITINE PO) Take 1 tablet by mouth daily.     amLODipine-olmesartan (AZOR) 10-40 MG tablet Take 1 tablet by mouth daily.     aspirin 81 MG tablet  Take 1 tablet (81 mg total) by mouth daily. 30 tablet    budesonide (RHINOCORT AQUA) 32 MCG/ACT nasal spray Place 2 sprays into both nostrils daily.     Coenzyme Q10 (COQ-10 PO) Take 1 tablet by mouth daily.     diclofenac Sodium (VOLTAREN) 1 % GEL Apply 1 application topically 4 (four) times daily as needed (pain).     Ibuprofen-Acetaminophen (ADVIL DUAL ACTION) 125-250 MG TABS Take 1 tablet by mouth daily as needed (pain).     linaclotide (LINZESS) 145 MCG CAPS capsule Take 1 capsule (145 mcg total) by mouth daily as needed (constipation). 30 capsule 0   Misc Natural Products (PROSTATE SUPPORT PO) Take 1 tablet by mouth  daily.      ondansetron (ZOFRAN) 4 MG tablet TAKE 1 TABLET BY MOUTH EVERY 8 HOURS AS NEEDED FOR NAUSEA AND VOMITING 30 tablet 1   pantoprazole (PROTONIX) 40 MG tablet Take 40 mg by mouth daily.     pravastatin (PRAVACHOL) 10 MG tablet Take 10 mg by mouth every evening.     Probiotic Product (PROBIOTIC PO) Take 1 capsule by mouth daily.     vitamin C (ASCORBIC ACID) 500 MG tablet Take 1,000 mg by mouth daily.     Zinc 30 MG CAPS Take 30 mg by mouth daily.      No results found for this or any previous visit (from the past 48 hour(s)). No results found.  Review of Systems  Blood pressure 139/66, pulse (!) 57, temperature 97.8 F (36.6 C), temperature source Oral, resp. rate 18, height '6\' 1"'$  (1.854 m), weight 77.1 kg, SpO2 100 %. Physical Exam HENT:     Mouth/Throat:     Mouth: Mucous membranes are moist.     Pharynx: Oropharynx is clear.  Eyes:     General: No scleral icterus.    Conjunctiva/sclera: Conjunctivae normal.  Cardiovascular:     Rate and Rhythm: Normal rate and regular rhythm.     Heart sounds: Normal heart sounds. No murmur heard. Pulmonary:     Effort: Pulmonary effort is normal.     Breath sounds: Normal breath sounds.  Abdominal:     General: There is no distension.     Palpations: Abdomen is soft. There is no mass.     Tenderness: There is no abdominal tenderness.  Musculoskeletal:        General: No swelling.  Skin:    General: Skin is warm and dry.  Neurological:     Mental Status: He is alert.     Assessment/Plan  Nausea and weight loss. Diagnostic esophagogastroduodenoscopy.  Hildred Laser, MD 03/30/2021, 8:25 AM

## 2021-03-30 NOTE — Op Note (Signed)
[facility] Patient Name: Noah Rodgers Procedure Date: 03/30/2021 8:08 AM MRN: [id number] Date of Birth: 09/16/1945 Attending MD: Hildred Laser , MD CSN: [id number] Age: 75 Admit Type: Outpatient Procedure:                Upper GI endoscopy Indications:              Nausea, Weight loss Providers:                Hildred Laser, MD, Lambert Mody, Aram Candela Referring MD:             Laray Anger, NP Medicines:                Lidocaine spray, Meperidine 50 mg IV, Midazolam 6                            mg IV Complications:            No immediate complications. Estimated Blood Loss:     Estimated blood loss was minimal. Procedure:                Pre-Anesthesia Assessment:                           - Prior to the procedure, a History and Physical                            was performed, and patient medications and                            allergies were reviewed. The patient's tolerance of                            previous anesthesia was also reviewed. The risks                            and benefits of the procedure and the sedation                            options and risks were discussed with the patient.                            All questions were answered, and informed consent                            was obtained. Prior Anticoagulants: The patient has                            taken no previous anticoagulant or antiplatelet                            agents except for aspirin. ASA Grade Assessment: II                            - A patient with mild systemic disease. After  reviewing the risks and benefits, the patient was                            deemed in satisfactory condition to undergo the                            procedure.                           After obtaining informed consent, the endoscope was                            passed under direct vision. Throughout the                            procedure, the  patient's blood pressure, pulse, and                            oxygen saturations were monitored continuously. The                            [id number]) scope was introduced through the                            mouth, and advanced to the second part of duodenum.                            The upper GI endoscopy was accomplished without                            difficulty. The patient tolerated the procedure                            well. Scope In: 8:40:05 AM Scope Out: 8:48:34 AM Total Procedure Duration: 0 hours 8 minutes 29 seconds  Findings:      The hypopharynx was normal.      The examined esophagus was normal.      The Z-line was irregular and was found 43 cm from the incisors.      Patchy mild inflammation characterized by congestion (edema) and       erythema was found in the gastric antrum. Biopsies were taken with a       cold forceps for histology. The pathology specimen was placed into       Bottle Number 1.      The exam of the stomach was otherwise normal.      Patchy mild inflammation characterized by congestion (edema) and       erythema was found in the duodenal bulb.      The second portion of the duodenum was normal. Impression:               - Normal hypopharynx.                           - Normal esophagus.                           -  Z-line irregular, 43 cm from the incisors.                           - Gastritis. Biopsied.                           - Duodenitis.                           - Normal second portion of the duodenum. Moderate Sedation:      Moderate (conscious) sedation was administered by the endoscopy nurse       and supervised by the endoscopist. The following parameters were       monitored: oxygen saturation, heart rate, blood pressure, CO2       capnography and response to care. Total physician intraservice time was       13 minutes. Recommendation:           - Patient has a contact number available for                             emergencies. The signs and symptoms of potential                            delayed complications were discussed with the                            patient. Return to normal activities tomorrow.                            Written discharge instructions were provided to the                            patient.                           - Resume previous diet today.                           - Continue present medications.                           - No aspirin, ibuprofen, naproxen, or other                            non-steroidal anti-inflammatory drugs for 1 day.                           - Await pathology results.                           - CBC, sed rate and CMET today. Procedure Code(s):        --- Professional ---                           [phone], Esophagogastroduodenoscopy, flexible,  transoral; with biopsy, single or multiple                           G0500, Moderate sedation services provided by the                            same physician or other qualified health care                            professional performing a gastrointestinal                            endoscopic service that sedation supports,                            requiring the presence of an independent trained                            observer to assist in the monitoring of the                            patient's level of consciousness and physiological                            status; initial 15 minutes of intra-service time;                            patient age 67 years or older (additional time may                            be reported with [phone], as appropriate) Diagnosis Code(s):        --- Professional ---                           K22.8, Other specified diseases of esophagus                           K29.70, Gastritis, unspecified, without bleeding                           K29.80, Duodenitis without bleeding                           R11.0, Nausea                            R63.4, Abnormal weight loss CPT copyright 2019 [employer]. All rights reserved. The codes documented in this report are preliminary and upon coder review may  be revised to meet current compliance requirements. Hildred Laser, MD Hildred Laser, MD 03/30/2021 9:04:48 AM This report has been signed electronically. Number of Addenda: 0

## 2021-03-31 LAB — SURGICAL PATHOLOGY

## 2021-04-03 ENCOUNTER — Other Ambulatory Visit (INDEPENDENT_AMBULATORY_CARE_PROVIDER_SITE_OTHER): Payer: Self-pay

## 2021-04-03 ENCOUNTER — Encounter (INDEPENDENT_AMBULATORY_CARE_PROVIDER_SITE_OTHER): Payer: Self-pay

## 2021-04-03 DIAGNOSIS — R110 Nausea: Secondary | ICD-10-CM

## 2021-04-03 DIAGNOSIS — D72819 Decreased white blood cell count, unspecified: Secondary | ICD-10-CM

## 2021-04-03 DIAGNOSIS — D696 Thrombocytopenia, unspecified: Secondary | ICD-10-CM

## 2021-04-05 ENCOUNTER — Encounter (HOSPITAL_COMMUNITY): Payer: Self-pay | Admitting: Internal Medicine

## 2021-04-25 ENCOUNTER — Telehealth (INDEPENDENT_AMBULATORY_CARE_PROVIDER_SITE_OTHER): Payer: Self-pay | Admitting: Internal Medicine

## 2021-04-25 ENCOUNTER — Other Ambulatory Visit (INDEPENDENT_AMBULATORY_CARE_PROVIDER_SITE_OTHER): Payer: Self-pay | Admitting: Internal Medicine

## 2021-04-25 MED ORDER — DEXLANSOPRAZOLE 60 MG PO CPDR: 60.0000 mg | DELAYED_RELEASE_CAPSULE | Freq: Every day | ORAL | 5 refills | Status: DC

## 2021-04-25 NOTE — Telephone Encounter (Signed)
Results of [place] discussed with patient's wife Noah Rodgers. [place] negative for cholelithiasis or GB wall thickening. Patient burps often. CBC pending.  Please make an appointment with me in 4 to 5 weeks.

## 2021-04-26 ENCOUNTER — Telehealth (INDEPENDENT_AMBULATORY_CARE_PROVIDER_SITE_OTHER): Payer: Self-pay

## 2021-04-26 NOTE — Telephone Encounter (Signed)
Patient wife Trevar Boehringer called today stating her husbands Dexlansoprazole 60 mg capsules for # 30 cost around $ 400.00. She wanted me to do a Prior Auth on it, which I did,but with the patient insurance did not require a PA. I called and left a detailed message that the medication is covered,but it is the price they are quoting as no PA is needed, she may need to either get a good rx card to see if this helps decrease the price or we can send in an alternative. I left message on vm asked that she please call the office back to discuss how she would like [place] to proceed.

## 2021-04-27 ENCOUNTER — Encounter (INDEPENDENT_AMBULATORY_CARE_PROVIDER_SITE_OTHER): Payer: Self-pay

## 2021-04-28 NOTE — Telephone Encounter (Signed)
Patient wife states she picked up the medication at the pharmacy and used a discount card and medication was still $175.00. She states she will have the patient take these and if they decide to changed to something else she will let us know. Patient wife aware of upcoming appointment in November.

## 2021-05-30 ENCOUNTER — Ambulatory Visit (INDEPENDENT_AMBULATORY_CARE_PROVIDER_SITE_OTHER): Payer: Medicare Other | Admitting: Internal Medicine

## 2021-05-30 ENCOUNTER — Encounter (INDEPENDENT_AMBULATORY_CARE_PROVIDER_SITE_OTHER): Payer: Self-pay | Admitting: Internal Medicine

## 2021-05-30 ENCOUNTER — Other Ambulatory Visit: Payer: Self-pay

## 2021-05-30 VITALS — BP 146/77 | HR 66 | Temp 97.7°F | Ht 73.0 in | Wt 183.0 lb

## 2021-05-30 DIAGNOSIS — R634 Abnormal weight loss: Secondary | ICD-10-CM

## 2021-05-30 DIAGNOSIS — R110 Nausea: Secondary | ICD-10-CM | POA: Diagnosis not present

## 2021-05-30 DIAGNOSIS — R142 Eructation: Secondary | ICD-10-CM | POA: Insufficient documentation

## 2021-05-30 MED ORDER — ESCITALOPRAM OXALATE 20 MG PO TABS: 20.0000 mg | ORAL_TABLET | Freq: Every day | ORAL | 2 refills | Status: DC

## 2021-05-30 NOTE — Patient Instructions (Addendum)
Physician will call with results of CT when completed. If you experience any side effects with Lexapro/escitalopram can decrease dose to 10 mg daily.  Take it at bedtime. Physician will call with results of CT when completed.

## 2021-05-30 NOTE — Progress Notes (Signed)
Presenting complaint;  Nausea and weight loss.  Database and subjective:  Patient is 75 year old Caucasian male who is here for scheduled visit accompanied by his wife Noah Rodgers. He was last seen in the office on 02/09/2021 for nausea weight loss intermittent epigastric pain and early satiety. He underwent EGD 2 months ago which revealed mild antral gastritis and bulbar duodenitis.  Gastric biopsy was negative for H. pylori. Lab studies including sed rate of 12.  Comprehensive chemistry panel was normal except for serum potassium of 3.4.  However CBC revealed leukopenia with WBC of 3.9 and platelet count of 125K. He was switched to dexlansoprazole.  His co-pay was very high.  He did take it for few weeks and could not tell any difference.  He is gone back to pantoprazole. He remains with daily nausea.  He feels is worse when he wakes up in the morning.  Sometimes it gets better after he eats and on other times it does not.  His nausea has never associated with vomiting.  He is eating 3 meals a day.  His meals are small.  He is also trying to eat some snacks.  He is taking Pepto-Bismol few sips here and there.  He is also using as many as 6 times daily for heartburn.  Does not want to try another PPI.  He burps all the time.  He took Zofran but it made him feel absent-minded.  He stays busy.  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  He states he takes Valium occasionally which is not listed below.  Lately he has been stressed out.  In the last few months he has lost 2 first cousins and aunt and an uncle.  1 cousin was 90 years old who died suddenly and he was very close to him. Patient says he does not like to travel or socialize.  At times he does feel depressed.  He also complains of frequent urination secondary to large prostate.  He gets up 3 times every night. His baseline weight has been around 204 pounds.  He weighed 182 pounds on February 09, 2021 and now he weighs 183 pounds.  He had CBC on 05/10/2021 as recommended.   Current Medications: Outpatient Encounter Medications as of 05/30/2021  Medication Sig   Acetylcarnitine HCl (ACETYL L-CARNITINE PO) Take 1 tablet by mouth daily.   amLODipine-olmesartan (AZOR) 10-40 MG tablet Take 1 tablet by mouth daily.   aspirin 81 MG tablet Take 1 tablet (81 mg total) by mouth daily.   bismuth subsalicylate (PEPTO BISMOL) 262 MG/15ML suspension Take 30 mLs by mouth every 6 (six) hours as needed. Twice daily as needed   budesonide (RHINOCORT AQUA) 32 MCG/ACT nasal spray Place 2 sprays into both nostrils daily.   Calcium Carbonate Antacid (TUMS E-X PO) Take by mouth. About 6 per day   Coenzyme Q10 (COQ-10 PO) Take 1 tablet by mouth daily.   diclofenac Sodium (VOLTAREN) 1 % GEL Apply 1 application topically 4 (four) times daily as needed (pain).   Ibuprofen-Acetaminophen (ADVIL DUAL ACTION) 125-250 MG TABS Take 1 tablet by mouth daily as needed (pain).   Misc Natural Products (PROSTATE SUPPORT PO) Take 1 tablet by mouth daily.    pantoprazole (PROTONIX) 40 MG tablet Take 40 mg by mouth daily.   pravastatin (PRAVACHOL) 10 MG tablet Take 10 mg by mouth every evening.   Probiotic Product (PROBIOTIC PO) Take 1 capsule  by mouth daily.   vitamin C (ASCORBIC ACID) 500 MG tablet Take 1,000 mg by mouth daily.   Zinc 30 MG CAPS Take 30 mg by mouth daily.   dexlansoprazole (DEXILANT) 60 MG capsule Take 1 capsule (60 mg total) by mouth daily. (Patient not taking: Reported on 05/30/2021)   linaclotide (LINZESS) 145 MCG CAPS capsule Take 1 capsule (145 mcg total) by mouth daily as needed (constipation). (Patient not taking: Reported on 05/30/2021)   ondansetron (ZOFRAN) 4 MG tablet TAKE 1 TABLET BY MOUTH EVERY 8 HOURS AS NEEDED FOR NAUSEA AND VOMITING (Patient not taking: Reported on 05/30/2021)   No facility-administered encounter medications on file as of 05/30/2021.     Objective: Blood pressure (!)  146/77, pulse 66, temperature 97.7 F (36.5 C), temperature source Oral, height 6' 1"  (1.854 m), weight 183 lb (83 kg). Patient is alert and in no acute distress. Conjunctiva is pink. Sclera is nonicteric Oropharyngeal mucosa is normal. No neck masses or thyromegaly noted. Cardiac exam with regular rhythm normal S1 and S2. No murmur or gallop noted. Lungs are clear to auscultation. Abdomen is symmetrical.  Bowel sounds are normal.  On palpation abdomen is soft and nontender with organomegaly or masses. No LE edema or clubbing noted.  Labs/studies Results:   CBC Latest Ref Rng & Units 03/30/2021 02/20/2018 06/12/2016  WBC 4.0 - 10.5 K/uL 3.9(L) 6.1 8.3  Hemoglobin 13.0 - 17.0 g/dL 13.3 14.5 14.8  Hematocrit 39.0 - 52.0 % 39.0 41.4 44.3  Platelets 150 - 400 K/uL 125(L) 171 207    CMP Latest Ref Rng & Units 03/30/2021 02/20/2018 06/12/2016  Glucose 70 - 99 mg/dL 95 90 92  BUN 8 - 23 mg/dL 17 14 16   Creatinine 0.61 - 1.24 mg/dL 0.68 0.83 0.76  Sodium 135 - 145 mmol/L 142 142 139  Potassium 3.5 - 5.1 mmol/L 3.4(L) 3.6 3.4(L)  Chloride 98 - 111 mmol/L 111 105 104  CO2 22 - 32 mmol/L 25 30 28   Calcium 8.9 - 10.3 mg/dL 8.4(L) 9.7 9.5  Total Protein 6.5 - 8.1 g/dL 6.5 7.1 -  Total Bilirubin 0.3 - 1.2 mg/dL 0.9 0.9 -  Alkaline Phos 38 - 126 U/L 61 - -  AST 15 - 41 U/L 18 20 -  ALT 0 - 44 U/L 16 20 -    Hepatic Function Latest Ref Rng & Units 03/30/2021 02/20/2018  Total Protein 6.5 - 8.1 g/dL 6.5 7.1  Albumin 3.5 - 5.0 g/dL 4.1 -  AST 15 - 41 U/L 18 20  ALT 0 - 44 U/L 16 20  Alk Phosphatase 38 - 126 U/L 61 -  Total Bilirubin 0.3 - 1.2 mg/dL 0.9 0.9    Sed rate 12.  Lab data from 05/10/2021 WBC 4.64. H&H 13.9 and 42.1 Platelet count 153K. Differential reveals neutrophils 64.2%, lymphocytes 24.4%, monocytes 6.9%, eosinophils 3.2% and basos 0.9%, immature granulocytes 0.4%  Assessment:  #1.  Weight loss.  Etiology of weight loss is not clear.  It may well be due to diminished calorie  intake.  Release he has not lost any weight since his last visit of February 09, 2021.  There is concern for occult malignancy.  #2.  Nausea.  Nausea is not associated with vomiting.  I wonder if nausea is central in origin or secondary to depression.  Patient is agreeable to try SSRI.  #3.  Chronic GERD.  Recent EGD did not reveal erosive esophagitis or Barrett's esophagus.  Symptom control not satisfactory with pantoprazole.  He is using OTC medications for breakthrough symptoms.    #4.  History of colonic adenomas.  Last colonoscopy was in December 2017.  #5.  CBC is now normal.  Leukopenia and thrombocytopenia does not appear to be chronic.   Plan:  Proceed with abdominal pelvic CT with contrast. Citalopram 20 mg by mouth daily at bedtime. Continue pantoprazole at current dose for now. Timing of surveillance colonoscopy to be determined. Office visit in 3 months.

## 2021-06-01 ENCOUNTER — Other Ambulatory Visit (INDEPENDENT_AMBULATORY_CARE_PROVIDER_SITE_OTHER): Payer: Self-pay

## 2021-06-01 DIAGNOSIS — R112 Nausea with vomiting, unspecified: Secondary | ICD-10-CM

## 2021-06-01 DIAGNOSIS — R634 Abnormal weight loss: Secondary | ICD-10-CM

## 2021-06-01 DIAGNOSIS — R142 Eructation: Secondary | ICD-10-CM

## 2021-06-01 DIAGNOSIS — R110 Nausea: Secondary | ICD-10-CM

## 2021-06-07 ENCOUNTER — Encounter (INDEPENDENT_AMBULATORY_CARE_PROVIDER_SITE_OTHER): Payer: Self-pay

## 2021-06-12 ENCOUNTER — Encounter (INDEPENDENT_AMBULATORY_CARE_PROVIDER_SITE_OTHER): Payer: Self-pay | Admitting: *Deleted

## 2021-06-16 ENCOUNTER — Telehealth (INDEPENDENT_AMBULATORY_CARE_PROVIDER_SITE_OTHER): Payer: Self-pay | Admitting: Internal Medicine

## 2021-06-16 NOTE — Telephone Encounter (Signed)
CT results reviewed with patient. No abnormality noted to account for his weight loss nausea and poor appetite.

## 2021-06-20 ENCOUNTER — Encounter (INDEPENDENT_AMBULATORY_CARE_PROVIDER_SITE_OTHER): Payer: Self-pay

## 2021-06-25 ENCOUNTER — Other Ambulatory Visit (INDEPENDENT_AMBULATORY_CARE_PROVIDER_SITE_OTHER): Payer: Self-pay | Admitting: Internal Medicine

## 2021-06-26 NOTE — Telephone Encounter (Signed)
Dr. Laural Golden started this med on 05/30/21. Pt has follow up in march.

## 2021-09-07 ENCOUNTER — Telehealth (INDEPENDENT_AMBULATORY_CARE_PROVIDER_SITE_OTHER): Payer: Self-pay | Admitting: *Deleted

## 2021-09-07 NOTE — Telephone Encounter (Signed)
Spoke to patient's wife on 08/08/21 to schedule TCS - she informed he had had a heart attack and they would be going to see cardiologist - she would call me back at later date to schedule  [facility] Room 3  Referring MD/PCP: elliott  Procedure: tcs  Reason/Indication:  hx polyps  Has patient had this procedure before?  Yes, 06/2016  If so, when, by whom and where?    Is there a family history of colon cancer?  no  Who?  What age when diagnosed?    Is patient diabetic? If yes, Type 1 or Type 2   no      Does patient have prosthetic heart valve or mechanical valve?  no  Do you have a pacemaker/defibrillator?  no  Has patient ever had endocarditis/atrial fibrillation? no  Does patient use oxygen? no  Has patient had joint replacement within last 12 months?  no  Is patient constipated or do they take laxatives? no  Does patient have a history of alcohol/drug use?  no  Have you had a stroke/heart attack last 6 mths? Yes, heart attack  Do you take medicine for weight loss?  no  For male patients,: have you had a hysterectomy                       are you post menopausal                       do you still have your menstrual cycle   Is patient on blood thinner such as Coumadin, Plavix and/or Aspirin? yes  Medications: asa 81 mg daily, amlodipine/olmesartan 10/40 mg daily, pantoprazole 40 mg daily, pravastatin 10 mg daily, hydrocodone 5 mg 1/2 tab prn, Vit C daily, zinc daily  Allergies: nkda  Medication Adjustment per Dr Rehman/Dr Jenetta Downer   Procedure date & time:

## 2021-10-03 ENCOUNTER — Encounter (INDEPENDENT_AMBULATORY_CARE_PROVIDER_SITE_OTHER): Payer: Self-pay | Admitting: Internal Medicine

## 2021-10-03 ENCOUNTER — Other Ambulatory Visit (INDEPENDENT_AMBULATORY_CARE_PROVIDER_SITE_OTHER): Payer: Self-pay

## 2021-10-03 ENCOUNTER — Encounter (INDEPENDENT_AMBULATORY_CARE_PROVIDER_SITE_OTHER): Payer: Self-pay

## 2021-10-03 ENCOUNTER — Ambulatory Visit (INDEPENDENT_AMBULATORY_CARE_PROVIDER_SITE_OTHER): Payer: Medicare Other | Admitting: Internal Medicine

## 2021-10-03 ENCOUNTER — Other Ambulatory Visit: Payer: Self-pay

## 2021-10-03 VITALS — BP 128/70 | HR 66 | Temp 97.9°F | Ht 73.0 in | Wt 183.8 lb

## 2021-10-03 DIAGNOSIS — R634 Abnormal weight loss: Secondary | ICD-10-CM

## 2021-10-03 DIAGNOSIS — R110 Nausea: Secondary | ICD-10-CM | POA: Diagnosis not present

## 2021-10-03 DIAGNOSIS — Z860100 Personal history of colon polyps, unspecified: Secondary | ICD-10-CM

## 2021-10-03 DIAGNOSIS — K219 Gastro-esophageal reflux disease without esophagitis: Secondary | ICD-10-CM

## 2021-10-03 MED ORDER — DOCUSATE SODIUM 100 MG PO CAPS: 200.0000 mg | ORAL_CAPSULE | Freq: Every day | ORAL | 0 refills | Status: AC

## 2021-10-03 NOTE — Progress Notes (Signed)
Presenting complaint; ? ?Follow-up for nausea and weight loss. ? ?Database and subjective: ? ?Patient is 76 year old Caucasian male who is here for scheduled visit accompanied by his wife Noah Rodgers.  I last saw him in the office on 05/30/2021 when he weighed 283 pounds.  Today his weight is the same. ?He has history of nausea epigastric pain and weight loss.  He underwent EGD in September 2022 revealing mild gastritis and duodenitis but H. pylori serology was negative.  Lab studies are unremarkable except mild leukopenia and thrombocytopenia.  He had blood work on 05/10/2021 and his WBC and platelet count were normal.  He had CT abdomen and pelvis on 06/15/2021 revealing hepatic cysts and constipation. ? ?On his last visit of 05/20/2021 I recommended citalopram 20 mg at bedtime.  He was advised to continue pantoprazole for GERD symptoms. ? ?Patient states he is doing much better.  He decided not to take citalopram.  His nausea is intermittent.  He had an episode 4 days ago.  It was self-limiting.  He has not taken Zofran in over 3 months.  He is using Tums 3-4 times a week in addition to pantoprazole.  He is also using Pepto-Bismol on as-needed basis.  He is taking Colace and it has helped with constipation. ?His bowels are moving better than they have been before.  He denies melena or rectal bleeding.  He remains very active.  Patient's wife tells me that they got a call from one of the nurses tell him that he had had an MI and that was due to a mistake. ?He does complain of intermittent soreness in left lower quadrant of abdomen.  He denies hematuria fever or chills. ?He states he is marijuana which helps. ? ?Patient would like to proceed with surveillance colonoscopy before I retire.  He has a history of colonic adenomas and his last exam was in December 2017. ? ?Current Medications: ?Outpatient Encounter Medications as of 10/03/2021  ?Medication Sig  ? Acetylcarnitine HCl (ACETYL L-CARNITINE PO) Take 1 tablet by mouth  daily.  ? amLODipine-olmesartan (AZOR) 10-40 MG tablet Take 1 tablet by mouth daily.  ? aspirin 81 MG tablet Take 1 tablet (81 mg total) by mouth daily.  ? bismuth subsalicylate (PEPTO BISMOL) 262 MG/15ML suspension Take 30 mLs by mouth every 6 (six) hours as needed. Twice daily as needed  ? budesonide (RHINOCORT AQUA) 32 MCG/ACT nasal spray Place 2 sprays into both nostrils daily.  ? Calcium Carbonate Antacid (TUMS E-X PO) Take by mouth. Takes as needed  ? Coenzyme Q10 (COQ-10 PO) Take 1 tablet by mouth daily.  ? diclofenac Sodium (VOLTAREN) 1 % GEL Apply 1 application topically 4 (four) times daily as needed (pain).  ? Ibuprofen-Acetaminophen (ADVIL DUAL ACTION) 125-250 MG TABS Take 1 tablet by mouth daily as needed (pain).  ? Misc Natural Products (PROSTATE SUPPORT PO) Take 1 tablet by mouth daily.   ? ondansetron (ZOFRAN) 4 MG tablet TAKE 1 TABLET BY MOUTH EVERY 8 HOURS AS NEEDED FOR NAUSEA AND VOMITING  ? pantoprazole (PROTONIX) 40 MG tablet Take 40 mg by mouth daily.  ? pravastatin (PRAVACHOL) 10 MG tablet Take 10 mg by mouth every evening.  ? Probiotic Product (PROBIOTIC PO) Take 1 capsule by mouth daily.  ? vitamin C (ASCORBIC ACID) 500 MG tablet Take 1,000 mg by mouth daily.  ? Zinc 30 MG CAPS Take 30 mg by mouth daily.  ? escitalopram (LEXAPRO) 20 MG tablet TAKE 1 TABLET BY MOUTH EVERY DAY (Patient not taking:  Reported on 10/03/2021)  ? ?No facility-administered encounter medications on file as of 10/03/2021.  ? ? ? ?Objective: ?Blood pressure 128/70, pulse 66, temperature 97.9 ?F (36.6 ?C), temperature source Oral, height 6' 1"  (1.854 m), weight 183 lb 12.8 oz (83.4 kg). ?Patient was alert and in no acute distress. ?Conjunctiva is pink. Sclera is nonicteric ?Oropharyngeal mucosa is normal. ?No neck masses or thyromegaly noted. ?Cardiac exam with regular rhythm normal S1 and S2. No murmur or gallop noted. ?Lungs are clear to auscultation. ?Abdomen is symmetrical soft and nontender with organomegaly or  masses. ?No LE edema or clubbing noted. ? ?Labs/studies Results: ? ? ?CBC Latest Ref Rng & Units 03/30/2021 02/20/2018 06/12/2016  ?WBC 4.0 - 10.5 K/uL 3.9(L) 6.1 8.3  ?Hemoglobin 13.0 - 17.0 g/dL 13.3 14.5 14.8  ?Hematocrit 39.0 - 52.0 % 39.0 41.4 44.3  ?Platelets 150 - 400 K/uL 125(L) 171 207  ?  ?CMP Latest Ref Rng & Units 03/30/2021 02/20/2018 06/12/2016  ?Glucose 70 - 99 mg/dL 95 90 92  ?BUN 8 - 23 mg/dL 17 14 16   ?Creatinine 0.61 - 1.24 mg/dL 0.68 0.83 0.76  ?Sodium 135 - 145 mmol/L 142 142 139  ?Potassium 3.5 - 5.1 mmol/L 3.4(L) 3.6 3.4(L)  ?Chloride 98 - 111 mmol/L 111 105 104  ?CO2 22 - 32 mmol/L 25 30 28   ?Calcium 8.9 - 10.3 mg/dL 8.4(L) 9.7 9.5  ?Total Protein 6.5 - 8.1 g/dL 6.5 7.1 -  ?Total Bilirubin 0.3 - 1.2 mg/dL 0.9 0.9 -  ?Alkaline Phos 38 - 126 U/L 61 - -  ?AST 15 - 41 U/L 18 20 -  ?ALT 0 - 44 U/L 16 20 -  ?  ?Hepatic Function Latest Ref Rng & Units 03/30/2021 02/20/2018  ?Total Protein 6.5 - 8.1 g/dL 6.5 7.1  ?Albumin 3.5 - 5.0 g/dL 4.1 -  ?AST 15 - 41 U/L 18 20  ?ALT 0 - 44 U/L 16 20  ?Alk Phosphatase 38 - 126 U/L 61 -  ?Total Bilirubin 0.3 - 1.2 mg/dL 0.9 0.9  ?  ? ?Assessment: ? ?#1.  Nausea without vomiting.  Work-up has been negative including EGD lab studies and CT.  I suspect nausea secondary to depression.  Nausea has gotten better without any specific therapy.  He elected not to try citalopram. ? ?#2.  Weight loss.  Weight loss has leveled off.  He has not lost any weight since his last visit over 4 months ago.  We will continue to monitor. ? ?#3.  GERD.  He is doing well with therapy. ? ?#4.  LLQ abdominal pain.  Abdominal exam is benign.  Suspect secondary to constipation. ? ?#5.  History of colonic adenomas.  Last colonoscopy was over 5 years ago in December 2017. ? ? ?Plan: ? ?Schedule surveillance colonoscopy under monitored anesthesia care near future. ?Increase Colace to 200 mg by mouth daily at bedtime. ?Office visit in 6 months. ? ? ? ? ?

## 2021-10-03 NOTE — Patient Instructions (Signed)
Colace 200 mg by mouth daily at bedtime.  Can drop dose to 100 mg daily 20 mg is too much. ?High-fiber diet as discussed. ?Colonoscopy to be scheduled. ?

## 2021-11-09 ENCOUNTER — Encounter (INDEPENDENT_AMBULATORY_CARE_PROVIDER_SITE_OTHER): Payer: Self-pay | Admitting: *Deleted

## 2021-11-09 ENCOUNTER — Ambulatory Visit (HOSPITAL_COMMUNITY): Payer: Medicare Other | Admitting: Anesthesiology

## 2021-11-09 ENCOUNTER — Ambulatory Visit (HOSPITAL_COMMUNITY)
Admission: RE | Admit: 2021-11-09 | Discharge: 2021-11-09 | Disposition: A | Discharge status: Home / Self Care | Payer: Medicare Other | Attending: Internal Medicine | Admitting: Internal Medicine

## 2021-11-09 ENCOUNTER — Encounter (HOSPITAL_COMMUNITY)
Admission: RE | Disposition: A | Discharge status: Home / Self Care | Payer: Self-pay | Source: Home / Self Care | Attending: Internal Medicine

## 2021-11-09 ENCOUNTER — Other Ambulatory Visit: Payer: Self-pay

## 2021-11-09 ENCOUNTER — Ambulatory Visit (HOSPITAL_BASED_OUTPATIENT_CLINIC_OR_DEPARTMENT_OTHER): Payer: Medicare Other | Admitting: Anesthesiology

## 2021-11-09 ENCOUNTER — Encounter (HOSPITAL_COMMUNITY): Payer: Self-pay | Admitting: Internal Medicine

## 2021-11-09 DIAGNOSIS — Z86010 Personal history of colonic polyps: Secondary | ICD-10-CM

## 2021-11-09 DIAGNOSIS — Z87891 Personal history of nicotine dependence: Secondary | ICD-10-CM | POA: Diagnosis not present

## 2021-11-09 DIAGNOSIS — K644 Residual hemorrhoidal skin tags: Secondary | ICD-10-CM

## 2021-11-09 DIAGNOSIS — K5730 Diverticulosis of large intestine without perforation or abscess without bleeding: Secondary | ICD-10-CM | POA: Diagnosis not present

## 2021-11-09 DIAGNOSIS — R110 Nausea: Secondary | ICD-10-CM

## 2021-11-09 DIAGNOSIS — I10 Essential (primary) hypertension: Secondary | ICD-10-CM | POA: Insufficient documentation

## 2021-11-09 DIAGNOSIS — Z09 Encounter for follow-up examination after completed treatment for conditions other than malignant neoplasm: Secondary | ICD-10-CM | POA: Diagnosis not present

## 2021-11-09 DIAGNOSIS — F1290 Cannabis use, unspecified, uncomplicated: Secondary | ICD-10-CM | POA: Diagnosis not present

## 2021-11-09 DIAGNOSIS — K219 Gastro-esophageal reflux disease without esophagitis: Secondary | ICD-10-CM | POA: Diagnosis not present

## 2021-11-09 DIAGNOSIS — D123 Benign neoplasm of transverse colon: Secondary | ICD-10-CM | POA: Insufficient documentation

## 2021-11-09 DIAGNOSIS — J45909 Unspecified asthma, uncomplicated: Secondary | ICD-10-CM | POA: Insufficient documentation

## 2021-11-09 DIAGNOSIS — R634 Abnormal weight loss: Secondary | ICD-10-CM

## 2021-11-09 DIAGNOSIS — K635 Polyp of colon: Secondary | ICD-10-CM | POA: Diagnosis not present

## 2021-11-09 DIAGNOSIS — Z1211 Encounter for screening for malignant neoplasm of colon: Secondary | ICD-10-CM | POA: Insufficient documentation

## 2021-11-09 DIAGNOSIS — Z7722 Contact with and (suspected) exposure to environmental tobacco smoke (acute) (chronic): Secondary | ICD-10-CM | POA: Insufficient documentation

## 2021-11-09 DIAGNOSIS — K648 Other hemorrhoids: Secondary | ICD-10-CM

## 2021-11-09 HISTORY — PX: COLONOSCOPY WITH PROPOFOL: SHX5780

## 2021-11-09 HISTORY — PX: POLYPECTOMY: SHX5525

## 2021-11-09 LAB — HM COLONOSCOPY

## 2021-11-09 SURGERY — COLONOSCOPY WITH PROPOFOL
Anesthesia: General

## 2021-11-09 MED ORDER — PROPOFOL 500 MG/50ML IV EMUL
INTRAVENOUS | Status: DC | PRN
  Administered 2021-11-09: 125 ug/kg/min via INTRAVENOUS

## 2021-11-09 MED ORDER — LACTATED RINGERS IV SOLN: INTRAVENOUS | Status: DC

## 2021-11-09 MED ORDER — LIDOCAINE HCL (CARDIAC) PF 100 MG/5ML IV SOSY
PREFILLED_SYRINGE | INTRAVENOUS | Status: DC | PRN
  Administered 2021-11-09: 50 mg via INTRAVENOUS

## 2021-11-09 MED ORDER — PROPOFOL 10 MG/ML IV BOLUS
INTRAVENOUS | Status: DC | PRN
Start: 2021-11-09 — End: 2021-11-09
  Administered 2021-11-09: 40 mg via INTRAVENOUS
  Administered 2021-11-09: 20 mg via INTRAVENOUS
  Administered 2021-11-09: 80 mg via INTRAVENOUS

## 2021-11-09 NOTE — Op Note (Signed)
[facility] ?Patient Name: Noah Rodgers ?Procedure Date: 11/09/2021 9:47 AM ?MRN: [id number] ?Date of Birth: [birth year] ?Attending MD: Hildred Laser , MD ?CSN: [id number] ?Age: 76 ?Admit Type: Outpatient ?Procedure:                Colonoscopy ?Indications:              High risk colon cancer surveillance: Personal  ?                          history of colonic polyps ?Providers:                Hildred Laser, MD, Charlsie Quest Theda Sers RN, RN, Kenney Houseman  ?                          [place] ?Referring MD:             Laray Anger, NP ?Medicines:                Propofol per Anesthesia ?Complications:            No immediate complications. ?Estimated Blood Loss:     Estimated blood loss was minimal. ?Procedure:                Pre-Anesthesia Assessment: ?                          - Prior to the procedure, a History and Physical  ?                          was performed, and patient medications and  ?                          allergies were reviewed. The patient's tolerance of  ?                          previous anesthesia was also reviewed. The risks  ?                          and benefits of the procedure and the sedation  ?                          options and risks were discussed with the patient.  ?                          All questions were answered, and informed consent  ?                          was obtained. Prior Anticoagulants: The patient has  ?                          taken no previous anticoagulant or antiplatelet  ?                          agents except for aspirin. ASA Grade Assessment: II  ?                          -  A patient with mild systemic disease. After  ?                          reviewing the risks and benefits, the patient was  ?                          deemed in satisfactory condition to undergo the  ?                          procedure. ?                          After obtaining informed consent, the colonoscope  ?                          was passed under direct vision. Throughout the   ?                          procedure, the patient's blood pressure, pulse, and  ?                          oxygen saturations were monitored continuously. The  ?                          PCF-HQ190L ([id number]) scope was introduced through  ?                          the anus and advanced to the the cecum, identified  ?                          by appendiceal orifice and ileocecal valve. The  ?                          colonoscopy was performed without difficulty. The  ?                          patient tolerated the procedure well. The quality  ?                          of the bowel preparation was excellent. ?Scope In: 10:22:44 AM ?Scope Out: 10:38:43 AM ?Scope Withdrawal Time: 0 hours 8 minutes 40 seconds  ?Total Procedure Duration: 0 hours 15 minutes 59 seconds  ?Findings: ?     Skin tags were found on perianal exam. ?     A small polyp was found in the splenic flexure. The polyp was removed  ?     with a cold snare. Resection and retrieval were complete. ?     A few diverticula were found in the sigmoid colon. ?     External hemorrhoids were found during retroflexion. ?Impression:               - Perianal skin tags found on perianal exam. ?                          - One small polyp at the splenic flexure, removed  ?  with a cold snare. Resected and retrieved. ?                          - Diverticulosis in the sigmoid colon. ?                          - External hemorrhoids. ?Moderate Sedation: ?     Per Anesthesia Care ?Recommendation:           - Patient has a contact number available for  ?                          emergencies. The signs and symptoms of potential  ?                          delayed complications were discussed with the  ?                          patient. Return to normal activities tomorrow.  ?                          Written discharge instructions were provided to the  ?                          patient. ?                          - High fiber diet today. ?                           - Continue present medications. ?                          - Await pathology results. ?                          - No recommendation at this time regarding repeat  ?                          colonoscopy. ?Procedure Code(s):        --- Professional --- ?                          [phone], Colonoscopy, flexible; with removal of  ?                          tumor(s), polyp(s), or other lesion(s) by snare  ?                          technique ?Diagnosis Code(s):        --- Professional --- ?                          K64.4, Residual hemorrhoidal skin tags ?                          Z86.010, Personal history of colonic polyps ?  K63.5, Polyp of colon ?                          K57.30, Diverticulosis of large intestine without  ?                          perforation or abscess without bleeding ?CPT copyright 2019 [employer]. All rights reserved. ?The codes documented in this report are preliminary and upon coder review may  ?be revised to meet current compliance requirements. ?Hildred Laser, MD ?Hildred Laser, MD ?11/09/2021 10:46:05 AM ?This report has been signed electronically. ?Number of Addenda: 0 ?

## 2021-11-09 NOTE — H&P (Signed)
Noah Rodgers is an 76 y.o. male.   ?Chief Complaint: Patient is here for colonoscopy ?HPI: Patient is 76 year old Caucasian male who has a history of colonic adenomas and is here for surveillance colonoscopy.  He denies change in bowel habits or rectal bleeding.  He does noted mild discomfort in left lower quadrant.  His last colonoscopy was in December 2017 with removal of 3 polyps.  The largest polyp was 10 mm.  They were all tubular adenomas. ?Family history is negative for colon cancer. ?He is on low-dose aspirin which is on hold.  He also uses ibuprofen on as-needed basis. ? ? ?Past Medical History:  ?Diagnosis Date  ? Anxiety   ? Arthritis   ? Asthma   ? Bilateral hip pain   ? GERD (gastroesophageal reflux disease)   ? Hypercholesteremia   ? Hypertension   ? Nausea   ? ? ?Past Surgical History:  ?Procedure Laterality Date  ? BIOPSY  06/15/2016  ? Procedure: BIOPSY;  Surgeon: Rogene Houston, MD;  Location: [facility];  Service: Endoscopy;;  esophageal bx's  ? BIOPSY  03/30/2021  ? Procedure: BIOPSY;  Surgeon: Rogene Houston, MD;  Location: [facility];  Service: Endoscopy;;  ? COLONOSCOPY    ? 6 years ago in [place]  ? COLONOSCOPY WITH PROPOFOL N/A 06/15/2016  ? Tawonda Legaspi:(high risk surveillance due to hx of colonic polyps)-One 4 mm polyp at the splenic flexure  ? ESOPHAGEAL DILATION N/A 06/15/2016  ? Procedure: ESOPHAGEAL DILATION;  Surgeon: Rogene Houston, MD;  Location: [facility];  Service: Endoscopy;  Laterality: N/A;  ? ESOPHAGOGASTRODUODENOSCOPY N/A 03/30/2021  ? Procedure: ESOPHAGOGASTRODUODENOSCOPY (EGD);  Surgeon: Rogene Houston, MD;  Location: [facility];  Service: Endoscopy;  Laterality: N/A;  8:05  ? ESOPHAGOGASTRODUODENOSCOPY (EGD) WITH PROPOFOL N/A 06/15/2016  ? Malique Driskill: Normal esophagus.- Z-line irregular, 43 cm from the incisors. No endoscopic esophageal abnormality to explain patient's dysphagia.esophagus dilated, normal stomach and duodenal bulb, second portion of duodenum.  reflux esophagitis present  ? HERNIA REPAIR Right   ? Inguinal  ? POLYPECTOMY  06/15/2016  ? Procedure: POLYPECTOMY;  Surgeon: Rogene Houston, MD;  Location: [facility];  Service: Endoscopy;;  splenic flexure polyp, sigmoid colon polyp x2  ? SEPTOPLASTY    ? SHOULDER ARTHROSCOPY WITH LABRAL REPAIR Left   ? UPPER GASTROINTESTINAL ENDOSCOPY    ? ? ?Family History  ?Problem Relation Age of Onset  ? Arthritis Mother   ? Kidney disease Mother   ? Hypertension Father   ? Healthy Sister   ? Diabetes Brother   ? Colitis Son   ? Healthy Paternal Grandfather   ? ?Social History:  reports that he quit smoking about 53 years ago. His smoking use included cigarettes. He has a 1.00 pack-year smoking history. He has been exposed to tobacco smoke. He has never used smokeless tobacco. He reports current alcohol use. He reports current drug use. Drug: Marijuana. ? ?Allergies: No Known Allergies ? ?Medications Prior to Admission  ?Medication Sig Dispense Refill  ? Acetylcarnitine HCl (ACETYL L-CARNITINE PO) Take 1 tablet by mouth daily.    ? amLODipine-olmesartan (AZOR) 10-40 MG tablet Take 1 tablet by mouth daily.    ? aspirin 81 MG tablet Take 1 tablet (81 mg total) by mouth daily. 30 tablet   ? bismuth subsalicylate (PEPTO BISMOL) 262 MG/15ML suspension Take 30 mLs by mouth every 6 (six) hours as needed for indigestion. Twice daily as needed    ?  budesonide (RHINOCORT AQUA) 32 MCG/ACT nasal spray Place 2 sprays into both nostrils daily.    ? calcium carbonate (TUMS - DOSED IN MG ELEMENTAL CALCIUM) 500 MG chewable tablet Chew 1-2 tablets by mouth daily as needed for indigestion or heartburn.    ? Cholecalciferol (VITAMIN D3) 125 MCG (5000 UT) CAPS Take 5,000 Units by mouth daily.    ? Coenzyme Q10 (COQ-10 PO) Take 1 tablet by mouth daily.    ? diclofenac Sodium (VOLTAREN) 1 % GEL Apply 1 application topically 4 (four) times daily as needed (pain).    ? docusate sodium (COLACE) 100 MG capsule Take 2 capsules (200 mg total) by  mouth at bedtime. (Patient taking differently: Take 100 mg by mouth at bedtime.)  0  ? Ibuprofen-Acetaminophen (ADVIL DUAL ACTION) 125-250 MG TABS Take 1 tablet by mouth daily as needed (pain).    ? Misc Natural Products (PROSTATE SUPPORT PO) Take 1 tablet by mouth daily.     ? pantoprazole (PROTONIX) 40 MG tablet Take 40 mg by mouth daily.    ? pravastatin (PRAVACHOL) 10 MG tablet Take 10 mg by mouth every evening.    ? Probiotic Product (PROBIOTIC PO) Take 1 capsule by mouth daily.    ? vitamin C (ASCORBIC ACID) 500 MG tablet Take 1,000 mg by mouth daily.    ? Zinc 30 MG CAPS Take 30 mg by mouth daily.    ? ? ?No results found for this or any previous visit (from the past 48 hour(s)). ?No results found. ? ?Review of Systems ? ?Blood pressure 138/84, pulse 65, temperature 97.8 ?F (36.6 ?C), temperature source Oral, resp. rate 17, height '6\' 1"'$  (1.854 m), weight 74.8 kg, SpO2 100 %. ?Physical Exam ?HENT:  ?   Mouth/Throat:  ?   Mouth: Mucous membranes are moist.  ?   Pharynx: Oropharynx is clear.  ?Eyes:  ?   General: No scleral icterus. ?   Conjunctiva/sclera: Conjunctivae normal.  ?Cardiovascular:  ?   Rate and Rhythm: Normal rate and regular rhythm.  ?   Heart sounds: Normal heart sounds. No murmur heard. ?Pulmonary:  ?   Effort: Pulmonary effort is normal.  ?   Breath sounds: Normal breath sounds.  ?Abdominal:  ?   General: There is no distension.  ?   Palpations: Abdomen is soft. There is no mass.  ?   Tenderness: There is no abdominal tenderness.  ?Musculoskeletal:     ?   General: No swelling.  ?   Cervical back: Neck supple.  ?Lymphadenopathy:  ?   Cervical: No cervical adenopathy.  ?Skin: ?   General: Skin is warm and dry.  ?Neurological:  ?   Mental Status: He is alert.  ?  ? ?Assessment/Plan ? ?History of colonic adenomas ?Surveillance colonoscopy. ? ?Hildred Laser, MD ?11/09/2021, 10:11 AM ? ? ? ?

## 2021-11-09 NOTE — Anesthesia Postprocedure Evaluation (Signed)
Anesthesia Post Note ? ?Patient: Noah Rodgers ? ?Procedure(s) Performed: COLONOSCOPY WITH PROPOFOL ?POLYPECTOMY ? ?Patient location during evaluation: Endoscopy ?Anesthesia Type: General ?Level of consciousness: awake and alert and oriented ?Pain management: pain level controlled ?Vital Signs Assessment: post-procedure vital signs reviewed and stable ?Respiratory status: spontaneous breathing, nonlabored ventilation and respiratory function stable ?Cardiovascular status: blood pressure returned to baseline and stable ?Postop Assessment: no apparent nausea or vomiting ?Anesthetic complications: no ? ? ?No notable events documented. ? ? ?Last Vitals:  ?Vitals:  ? 11/09/21 0849 11/09/21 1041  ?BP: 138/84 113/61  ?Pulse: 65 81  ?Resp: 17 17  ?Temp: 36.6 ?C 36.6 ?C  ?SpO2: 100% 97%  ?  ?Last Pain:  ?Vitals:  ? 11/09/21 1041  ?TempSrc: Oral  ?PainSc: 0-No pain  ? ? ?  ?  ?  ?  ?  ?  ? ?Keiry Kowal C Malarie Tappen ? ? ? ? ?

## 2021-11-09 NOTE — Discharge Instructions (Signed)
Resume aspirin on 11/10/2021 ?Resume other medications as before ?High-fiber diet ?No driving for 24 hours ?Physician will call with biopsy results. ?

## 2021-11-09 NOTE — Transfer of Care (Signed)
Immediate Anesthesia Transfer of Care Note ? ?Patient: Noah Rodgers ? ?Procedure(s) Performed: COLONOSCOPY WITH PROPOFOL ?POLYPECTOMY ? ?Patient Location: Endoscopy Unit ? ?Anesthesia Type:General ? ?Level of Consciousness: awake and alert  ? ?Airway & Oxygen Therapy: Patient Spontanous Breathing ? ?Post-op Assessment: Report given to RN and Post -op Vital signs reviewed and stable ? ?Post vital signs: Reviewed and stable ? ?Last Vitals:  ?Vitals Value Taken Time  ?BP    ?Temp    ?Pulse 79   ?Resp 17   ?SpO2 99%   ? ? ?Last Pain:  ?Vitals:  ? 11/09/21 1017  ?TempSrc:   ?PainSc: 0-No pain  ?   ? ?Patients Stated Pain Goal: 6 (11/09/21 0849) ? ?Complications: No notable events documented. ?

## 2021-11-09 NOTE — Anesthesia Preprocedure Evaluation (Addendum)
Anesthesia Evaluation  ?Patient identified by MRN, date of birth, ID band ?Patient awake ? ? ? ?Reviewed: ?Allergy & Precautions, NPO status , Patient's Chart, lab work & pertinent test results ? ?Airway ?Mallampati: II ? ?TM Distance: >3 FB ?Neck ROM: Full ? ? ? Dental ? ?(+) Dental Advisory Given, Teeth Intact ?  ?Pulmonary ?asthma , former smoker,  ?  ?Pulmonary exam normal ?breath sounds clear to auscultation ? ? ? ? ? ? Cardiovascular ?Exercise Tolerance: Good ?hypertension, Pt. on medications ?Normal cardiovascular exam ?Rhythm:Regular Rate:Normal ? ? ?  ?Neuro/Psych ?Anxiety   ? GI/Hepatic ?Neg liver ROS, GERD  Medicated,  ?Endo/Other  ?negative endocrine ROS ? Renal/GU ?negative Renal ROS  ? ?  ?Musculoskeletal ? ?(+) Arthritis ,  ? Abdominal ?  ?Peds ? Hematology ?negative hematology ROS ?(+)   ?Anesthesia Other Findings ? ? Reproductive/Obstetrics ? ?  ? ? ? ? ? ? ? ? ? ? ? ? ? ?  ?  ? ? ? ? ? ? ? ?Anesthesia Physical ?Anesthesia Plan ? ?ASA: 2 ? ?Anesthesia Plan: General  ? ?Post-op Pain Management: Minimal or no pain anticipated  ? ?Induction: Intravenous ? ?PONV Risk Score and Plan: TIVA ? ?Airway Management Planned: Nasal Cannula and Natural Airway ? ?Additional Equipment:  ? ?Intra-op Plan:  ? ?Post-operative Plan:  ? ?Informed Consent: I have reviewed the patients History and Physical, chart, labs and discussed the procedure including the risks, benefits and alternatives for the proposed anesthesia with the patient or authorized representative who has indicated his/her understanding and acceptance.  ? ? ? ?Dental advisory given ? ?Plan Discussed with: CRNA and Surgeon ? ?Anesthesia Plan Comments:   ? ? ? ? ? ? ?Anesthesia Quick Evaluation ? ?

## 2021-11-10 LAB — SURGICAL PATHOLOGY

## 2021-11-14 ENCOUNTER — Encounter (HOSPITAL_COMMUNITY): Payer: Self-pay | Admitting: Internal Medicine

## 2022-05-26 ENCOUNTER — Encounter (INDEPENDENT_AMBULATORY_CARE_PROVIDER_SITE_OTHER): Payer: Self-pay | Admitting: Gastroenterology
